# Patient Record
Sex: Female | Born: 1945 | Race: White | Marital: Married | Smoking: Never smoker
Health system: Southern US, Community
[De-identification: ages and names within clinical notes are randomized; demographics above are authoritative.]

## PROBLEM LIST (undated history)

## (undated) DIAGNOSIS — K2980 Duodenitis without bleeding: Secondary | ICD-10-CM

## (undated) DIAGNOSIS — N390 Urinary tract infection, site not specified: Secondary | ICD-10-CM

## (undated) DIAGNOSIS — H269 Unspecified cataract: Secondary | ICD-10-CM

## (undated) DIAGNOSIS — K449 Diaphragmatic hernia without obstruction or gangrene: Secondary | ICD-10-CM

## (undated) DIAGNOSIS — D131 Benign neoplasm of stomach: Secondary | ICD-10-CM

## (undated) DIAGNOSIS — D649 Anemia, unspecified: Secondary | ICD-10-CM

## (undated) DIAGNOSIS — K219 Gastro-esophageal reflux disease without esophagitis: Secondary | ICD-10-CM

## (undated) DIAGNOSIS — K2270 Barrett's esophagus without dysplasia: Principal | ICD-10-CM

## (undated) DIAGNOSIS — M47816 Spondylosis without myelopathy or radiculopathy, lumbar region: Secondary | ICD-10-CM

## (undated) DIAGNOSIS — K5790 Diverticulosis of intestine, part unspecified, without perforation or abscess without bleeding: Secondary | ICD-10-CM

## (undated) DIAGNOSIS — E785 Hyperlipidemia, unspecified: Secondary | ICD-10-CM

## (undated) DIAGNOSIS — R6889 Other general symptoms and signs: Secondary | ICD-10-CM

## (undated) DIAGNOSIS — Q676 Pectus excavatum: Secondary | ICD-10-CM

## (undated) DIAGNOSIS — R011 Cardiac murmur, unspecified: Secondary | ICD-10-CM

## (undated) DIAGNOSIS — R002 Palpitations: Secondary | ICD-10-CM

## (undated) DIAGNOSIS — J45909 Unspecified asthma, uncomplicated: Secondary | ICD-10-CM

## (undated) DIAGNOSIS — J302 Other seasonal allergic rhinitis: Secondary | ICD-10-CM

## (undated) HISTORY — DX: Other seasonal allergic rhinitis: J30.2

## (undated) HISTORY — DX: Unspecified asthma, uncomplicated: J45.909

## (undated) HISTORY — DX: Barrett's esophagus without dysplasia: K22.70

## (undated) HISTORY — DX: Hyperlipidemia, unspecified: E78.5

## (undated) HISTORY — DX: Urinary tract infection, site not specified: N39.0

## (undated) HISTORY — DX: Anemia, unspecified: D64.9

## (undated) HISTORY — DX: Gastro-esophageal reflux disease without esophagitis: K21.9

## (undated) HISTORY — DX: Pectus excavatum: Q67.6

## (undated) HISTORY — DX: Diverticulosis of intestine, part unspecified, without perforation or abscess without bleeding: K57.90

## (undated) HISTORY — PX: SHOULDER SURGERY: SHX246

## (undated) HISTORY — PX: ESOPHAGOGASTRODUODENOSCOPY: SHX1529

## (undated) HISTORY — DX: Other general symptoms and signs: R68.89

## (undated) HISTORY — DX: Duodenitis without bleeding: K29.80

## (undated) HISTORY — PX: DILATION AND CURETTAGE OF UTERUS: SHX78

## (undated) HISTORY — PX: COLONOSCOPY: SHX174

## (undated) HISTORY — DX: Benign neoplasm of stomach: D13.1

## (undated) HISTORY — DX: Unspecified cataract: H26.9

## (undated) HISTORY — DX: Cardiac murmur, unspecified: R01.1

## (undated) HISTORY — DX: Spondylosis without myelopathy or radiculopathy, lumbar region: M47.816

## (undated) HISTORY — DX: Palpitations: R00.2

## (undated) HISTORY — PX: TONSILLECTOMY: SUR1361

## (undated) HISTORY — DX: Diaphragmatic hernia without obstruction or gangrene: K44.9

---

## 1998-11-10 ENCOUNTER — Other Ambulatory Visit: Admission: RE | Admit: 1998-11-10 | Discharge: 1998-11-10 | Payer: Self-pay | Admitting: Obstetrics & Gynecology

## 1999-04-27 ENCOUNTER — Other Ambulatory Visit: Admission: RE | Admit: 1999-04-27 | Discharge: 1999-04-27 | Payer: Self-pay | Admitting: Obstetrics & Gynecology

## 2000-07-18 ENCOUNTER — Other Ambulatory Visit: Admission: RE | Admit: 2000-07-18 | Discharge: 2000-07-18 | Payer: Self-pay | Admitting: Obstetrics and Gynecology

## 2001-09-17 ENCOUNTER — Other Ambulatory Visit: Admission: RE | Admit: 2001-09-17 | Discharge: 2001-09-17 | Payer: Self-pay | Admitting: Obstetrics & Gynecology

## 2002-09-30 ENCOUNTER — Other Ambulatory Visit: Admission: RE | Admit: 2002-09-30 | Discharge: 2002-09-30 | Payer: Self-pay | Admitting: Obstetrics & Gynecology

## 2003-10-01 ENCOUNTER — Other Ambulatory Visit: Admission: RE | Admit: 2003-10-01 | Discharge: 2003-10-01 | Payer: Self-pay | Admitting: Obstetrics & Gynecology

## 2004-10-10 ENCOUNTER — Other Ambulatory Visit: Admission: RE | Admit: 2004-10-10 | Discharge: 2004-10-10 | Payer: Self-pay | Admitting: Obstetrics & Gynecology

## 2013-11-05 ENCOUNTER — Other Ambulatory Visit: Payer: Self-pay | Admitting: Obstetrics & Gynecology

## 2013-11-05 DIAGNOSIS — R928 Other abnormal and inconclusive findings on diagnostic imaging of breast: Secondary | ICD-10-CM

## 2013-11-09 ENCOUNTER — Ambulatory Visit
Admission: RE | Admit: 2013-11-09 | Discharge: 2013-11-09 | Disposition: A | Discharge status: Home / Self Care | Payer: Medicare Other | Source: Ambulatory Visit | Attending: Obstetrics & Gynecology | Admitting: Obstetrics & Gynecology

## 2013-11-09 ENCOUNTER — Encounter (INDEPENDENT_AMBULATORY_CARE_PROVIDER_SITE_OTHER): Payer: Self-pay

## 2013-11-09 ENCOUNTER — Other Ambulatory Visit: Payer: Self-pay | Admitting: Obstetrics & Gynecology

## 2013-11-09 DIAGNOSIS — R928 Other abnormal and inconclusive findings on diagnostic imaging of breast: Secondary | ICD-10-CM

## 2013-11-12 ENCOUNTER — Other Ambulatory Visit: Payer: Self-pay

## 2014-06-18 ENCOUNTER — Encounter: Payer: Self-pay | Admitting: Internal Medicine

## 2014-08-25 ENCOUNTER — Ambulatory Visit (INDEPENDENT_AMBULATORY_CARE_PROVIDER_SITE_OTHER): Payer: Medicare Other | Admitting: Internal Medicine

## 2014-08-25 ENCOUNTER — Encounter: Payer: Self-pay | Admitting: Internal Medicine

## 2014-08-25 VITALS — BP 140/88 | HR 72 | Ht 62.5 in | Wt 125.0 lb

## 2014-08-25 DIAGNOSIS — R0989 Other specified symptoms and signs involving the circulatory and respiratory systems: Secondary | ICD-10-CM

## 2014-08-25 DIAGNOSIS — F458 Other somatoform disorders: Secondary | ICD-10-CM

## 2014-08-25 DIAGNOSIS — R1310 Dysphagia, unspecified: Secondary | ICD-10-CM

## 2014-08-25 DIAGNOSIS — K449 Diaphragmatic hernia without obstruction or gangrene: Secondary | ICD-10-CM

## 2014-08-25 DIAGNOSIS — K2270 Barrett's esophagus without dysplasia: Secondary | ICD-10-CM

## 2014-08-25 HISTORY — DX: Barrett's esophagus without dysplasia: K22.70

## 2014-08-25 MED ORDER — RABEPRAZOLE SODIUM 20 MG PO TBEC: 20.0000 mg | DELAYED_RELEASE_TABLET | Freq: Every day | ORAL | Status: DC

## 2014-08-25 NOTE — Progress Notes (Signed)
Subjective:    Patient ID: Anna Price, female    DOB: [birth year], 69 y.o.   MRN: [id number] Cc: Barrett's esophagus, globus, dysphagia? HPI The patient is a very nice elderly woman here from [place], at the recommendation of one of her friends who is my patient. 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 Short segment never had any dysplasia. Most recent endoscopy in September 2014 in [place]. He has a hiatal hernia I'm not exactly sure the size 3-4 cm perhaps greater. She has years of intermittent but fairly regular globus sensation and some question of dysphagia but that has been rare. Pills and food feel like they sit in the suprasternal notch after eating them. Last year or so she regurgitated after having a problem with a [place] chip. She is concerned that she is triggered problems since then. She is very concerned about the possible progression of Barrett's esophagus to cancer. Her father had Barrett's esophagus and limited to be in his 58s. She had an aunt with what sounds like GE junction cancer is diagnosed correctly but she looked for a decade with that so that makes me question that diagnosis. Over the years she has tried every single PPI it sounds like. She has had a fundic gland polyp or polyps. He has not had unexplained weight loss or bleeding. She thinks the best PPI has been AcipHex and recently went back to that and feels a little bit better. PPI medications do sometimes give her loose stools or diarrhea.  Note that her symptoms are globus, she actually regurgitation and reflux fluid at times but she does not have any classic heartburn. She probably does not have dysphagia.  She has recently given up sleeping on a wedge and she has done that for years and it does not seem to help. She does not smoke is only one caffeinated beverage daily. Allergies  Allergen Reactions  . Atropine   . Codeine   . Demerol [Meperidine]   . Erythromycin   . Nexium [Esomeprazole  Magnesium] Other (See Comments)    Violent headache  . Phenergan [Promethazine Hcl] Other (See Comments)    Rectal pain  . Cortisone Rash  . Prednisone Rash   Outpatient Prescriptions Prior to Visit  Medication Sig Dispense Refill  . fluticasone (FLOVENT HFA) 220 MCG/ACT inhaler Inhale 1 puff into the lungs as needed.     Marland Kitchen omeprazole-sodium bicarbonate (ZEGERID) 40-1100 MG per capsule Take 1 capsule by mouth daily before breakfast.    . ranitidine (ZANTAC) 300 MG tablet Take 300 mg by mouth at bedtime.     No facility-administered medications prior to visit.   Past Medical History  Diagnosis Date  . GERD (gastroesophageal reflux disease)   . Barrett esophagus   . Diverticulosis   . Hiatal hernia   . Duodenitis     chronic  . Hyperlipemia   . UTI (lower urinary tract infection)   . Heart palpitations     slow heart valve leak not thought to be a problem  . Pectus excavatum   . Fundic gland polyps of stomach, benign    Past Surgical History  Procedure Laterality Date  . Esophagogastroduodenoscopy    . Colonoscopy    . Dilation and curettage of uterus    . Tonsillectomy    . Shoulder surgery     History   Social History  . Marital Status: Married    Spouse Name: N/A  . Number of Children: N/A  .  Years of Education: N/A   Occupational History  . retired    Social History Main Topics  . Smoking status: Never Smoker   . Smokeless tobacco: Never Used  . Alcohol Use: 0.0 oz/week    0 Standard drinks or equivalent per week  . Drug Use: Not on file  . Sexual Activity: Not on file   Other Topics Concern  . None   Social History Narrative   Married   2 sons 1 is Paediatric nurse and daughter-in-law rheumatologist in [place]   Retired Pharmacist, hospital   1 cup coffee qd   08/25/2014      Family History  Problem Relation Age of Onset  . Stroke Mother     brain stem  . Congestive Heart Failure Father   . Esophageal cancer      aunt  . Parkinsonism Father   . Colon  cancer Neg Hx     Review of Systems As per history of present illness. All other review of systems are negative.    Objective:   Physical Exam @BP  140/88 mmHg  Pulse 72  Ht 5' 2.5" (1.588 m)  Wt 125 lb (56.7 kg)  BMI 22.48 kg/m2@  General:  Well-developed, well-nourished and in no acute distress Eyes:  anicteric. Neck:   supple w/o thyromegaly or mass.  Lungs: Clear to auscultation bilaterally. Heart:  S1S2, no rubs, murmurs, gallops. Abdomen:  soft, non-tender, no hepatosplenomegaly, hernia, or mass and BS+.  Lymph:  no cervical or supraclavicular adenopathy. Extremities:   no edema, cyanosis or clubbing Skin   no rash. Neuro:  A&O x 3.  Psych:  appropriate mood and  Affect overall but she does seem a little anxious    Data Reviewed: Numerous egd, pathology and GI office notes [phone] Pathology reports also Colonoscopy reports.    Assessment & Plan:   1. Barrett's esophagus   2. Hiatal hernia   3. Globus sensation   4. Dysphagia - ?    Long history of short segment Barrett's esophagus. One time she had an endoscopy without it. There is a hiatal hernia. The gentleman to question if she truly has Barrett's or intestinal metaplasia and a hiatal hernia sac. Symptoms of globus of unclear etiology. At first I did not think it was anxiety but has time has gone on in the discussion over her situation and scheduling her procedures she certainly at least has health related anxiety over this condition. She does freely admit she is scared of cancer.  ? hiatal hernia is causing some of her problems. I think this needs better characterization.  Plans are for #1 upper GI series #2 upper GI endoscopy to evaluate Barrett's esophagus and hiatal hernia #3 refill rabeprazole 20 mg daily at this point #4 I have explained that if she does indeed have Barrett's esophagus regular PPI therapy is recommended. He would like to come off it she has concerns over potential health issues such as  osteoporosis and renal failure etc. #5 I have reassured as best I can. Continue to do so. #6 Kaopectate or Imodium for loose stools and diarrhea associated with PPI. #7 she says she can live with these symptoms as long as she knows it is not cancer. Some consideration could be given to manometry and pH impedance testing depending upon where we need to go. #8 consider repeat routine colonoscopy versus other screening in 2021  I appreciate the opportunity to care for this patient. CC: Paulo Fruit, MD   60 minutes total  time involved in patient care with me today

## 2014-08-25 NOTE — Patient Instructions (Addendum)
  You have been scheduled for an endoscopy. Please follow written instructions given to you at your visit today. If you use inhalers (even only as needed), please bring them with you on the day of your procedure.   You have been scheduled for an Upper GI Series at [facility]. Your appointment is on  08/30/14 at 10 :00 AM . Please arrive 15 minutes prior to your test for registration. Make sure not to eat or drink anything after midnight on the night before your test. If you need to reschedule, please call radiology at [phone]. ________________________________________________________________ An upper GI series uses x rays to help diagnose problems of the upper GI tract, which includes the esophagus, stomach, and duodenum. The duodenum is the first part of the small intestine. An upper GI series is conducted by a radiology technologist or a radiologist-a doctor who specializes in x-ray imaging-at a hospital or outpatient center. While sitting or standing in front of an x-ray machine, the patient drinks barium liquid, which is often white and has a chalky consistency and taste. The barium liquid coats the lining of the upper GI tract and makes signs of disease show up more clearly on x rays. X-ray video, called fluoroscopy, is used to view the barium liquid moving through the esophagus, stomach, and duodenum. Additional x rays and fluoroscopy are performed while the patient lies on an x-ray table. To fully coat the upper GI tract with barium liquid, the technologist or radiologist may press on the abdomen or ask the patient to change position. Patients hold still in various positions, allowing the technologist or radiologist to take x rays of the upper GI tract at different angles. If a technologist conducts the upper GI series, a radiologist will later examine the images to look for problems.  This test typically takes about 1 hour to  complete. __________________________________________________________________   We have sent the following medications to your pharmacy for you to pick up at your convenience: Aciphex  You may use Kaopectate or Imodium for diarrhea as needed.  I appreciate the opportunity to care for you. Silvano Rusk, MD, [facility]

## 2014-08-30 ENCOUNTER — Ambulatory Visit (HOSPITAL_COMMUNITY)
Admission: RE | Admit: 2014-08-30 | Discharge: 2014-08-30 | Disposition: A | Discharge status: Home / Self Care | Payer: Medicare Other | Source: Ambulatory Visit | Attending: Internal Medicine | Admitting: Internal Medicine

## 2014-08-30 ENCOUNTER — Telehealth: Payer: Self-pay | Admitting: Internal Medicine

## 2014-08-30 DIAGNOSIS — K219 Gastro-esophageal reflux disease without esophagitis: Secondary | ICD-10-CM | POA: Diagnosis not present

## 2014-08-30 DIAGNOSIS — K449 Diaphragmatic hernia without obstruction or gangrene: Secondary | ICD-10-CM | POA: Diagnosis not present

## 2014-08-30 DIAGNOSIS — R0989 Other specified symptoms and signs involving the circulatory and respiratory systems: Secondary | ICD-10-CM

## 2014-08-30 DIAGNOSIS — R09A2 Foreign body sensation, throat: Secondary | ICD-10-CM

## 2014-08-30 NOTE — Telephone Encounter (Signed)
Patient advised she will keep the appt

## 2014-08-30 NOTE — Progress Notes (Signed)
Quick Note:  Patient aware of results See phone note ______

## 2014-08-30 NOTE — Telephone Encounter (Signed)
1) I do not think her globus is from reflux 2) EGD is optional at this time - I had recommended it as we discussed given her fear of Barrett's and cancer - to try to clear up if she actually had Barrett's which is uncertain  3) If she does not follow-through with EGD she should make a non-urgent f/u appt

## 2014-08-30 NOTE — Telephone Encounter (Signed)
See questions from the patient  

## 2014-08-31 ENCOUNTER — Other Ambulatory Visit (HOSPITAL_COMMUNITY): Payer: Medicare Other

## 2014-09-03 ENCOUNTER — Ambulatory Visit (AMBULATORY_SURGERY_CENTER): Payer: Medicare Other | Admitting: Internal Medicine

## 2014-09-03 ENCOUNTER — Encounter: Payer: Self-pay | Admitting: Internal Medicine

## 2014-09-03 VITALS — BP 116/80 | HR 76 | Temp 97.7°F | Resp 18 | Ht 62.5 in | Wt 125.0 lb

## 2014-09-03 DIAGNOSIS — K2270 Barrett's esophagus without dysplasia: Secondary | ICD-10-CM

## 2014-09-03 DIAGNOSIS — R0989 Other specified symptoms and signs involving the circulatory and respiratory systems: Secondary | ICD-10-CM

## 2014-09-03 DIAGNOSIS — K449 Diaphragmatic hernia without obstruction or gangrene: Secondary | ICD-10-CM | POA: Diagnosis not present

## 2014-09-03 DIAGNOSIS — F458 Other somatoform disorders: Secondary | ICD-10-CM

## 2014-09-03 DIAGNOSIS — R6889 Other general symptoms and signs: Secondary | ICD-10-CM

## 2014-09-03 HISTORY — DX: Other specified symptoms and signs involving the circulatory and respiratory systems: R09.89

## 2014-09-03 HISTORY — DX: Other general symptoms and signs: R68.89

## 2014-09-03 MED ORDER — SODIUM CHLORIDE 0.9 % IV SOLN
500.0000 mL | INTRAVENOUS | Status: DC
Start: 2014-09-03 — End: 2014-09-03

## 2014-09-03 NOTE — Progress Notes (Signed)
To recovery, report to En[place], RN, VSS 

## 2014-09-03 NOTE — Op Note (Signed)
[place]  [address] [place], [address]   ENDOSCOPY PROCEDURE REPORT  PATIENT: Anna, Price  MR#: [id number] BIRTHDATE: 02-27-1945 , 18  yrs. old GENDER: female ENDOSCOPIST: Gatha Mayer, MD, [facility] PROCEDURE DATE:  09/03/2014 PROCEDURE:  EGD, diagnostic ASA CLASS:     Class II INDICATIONS:  Globus and ? dysphagia - ? hx Barrett's. MEDICATIONS: Propofol 160 mg IV and Monitored anesthesia care TOPICAL ANESTHETIC: none  DESCRIPTION OF PROCEDURE: After the risks benefits and alternatives of the procedure were thoroughly explained, informed consent was obtained.  The LB [id number] endoscope was introduced through the mouth and advanced to the second portion of the duodenum , Without limitations.  The instrument was slowly withdrawn as the mucosa was fully examined.    1) 3-4 cm hiatal hernia 2) Fundic gland polyps of proximal stomach (prior biopsies proved this) 3) Otherwise normal EGD - no strictures, no Barrett's changes in the distal esophagus/GE junction area.  Retroflexed views revealed a hiatal hernia.     The scope was then withdrawn from the patient and the procedure completed.  COMPLICATIONS: There were no immediate complications.  ENDOSCOPIC IMPRESSION: 1) 3-4 cm hiatal hernia 2) Fundic gland polyps of proximal stomach (prior biopsies proved this) 3) Otherwise normal EGD - no strictures, no Barrett's changes in the distal esophagus/GE junction area  RECOMMENDATIONS: Continue current tx anxiety might be a cause of globus Would not do further routine EGD see me as needed   eSigned:  Gatha Mayer, MD, [facility] 09/03/2014 11:23 AM    CC:The Patient and Vania Rea, MD

## 2014-09-03 NOTE — Patient Instructions (Addendum)
I saw a small hiatal hernia and the fundic gland polyps. The polyps are not pre-cancerous and do not need follow-up. I do not think you have barrett's esophagus. I think the previous biopsies were from the hiatal hernia sac and not the esophagus or the Barrett's is gone.  The sensation in your neck or throat area is called globus - you have had it a long time. Sometimes it comes from anxiety/stress. No abnormalities seen here explain it. It is not related to cancer.  I appreciate the opportunity to care for you. Gatha Mayer, MD, FACG  YOU HAD AN ENDOSCOPIC PROCEDURE TODAY AT [facility]:   Refer to the procedure report that was given to you for any specific questions about what was found during the examination.  If the procedure report does not answer your questions, please call your gastroenterologist to clarify.  If you requested that your care partner not be given the details of your procedure findings, then the procedure report has been included in a sealed envelope for you to review at your convenience later.  YOU SHOULD EXPECT: Some feelings of bloating in the abdomen. Passage of more gas than usual.  Walking can help get rid of the air that was put into your GI tract during the procedure and reduce the bloating. If you had a lower endoscopy (such as a colonoscopy or flexible sigmoidoscopy) you may notice spotting of blood in your stool or on the toilet paper. If you underwent a bowel prep for your procedure, you may not have a normal bowel movement for a few days.  Please Note:  You might notice some irritation and congestion in your nose or some drainage.  This is from the oxygen used during your procedure.  There is no need for concern and it should clear up in a day or so.  SYMPTOMS TO REPORT IMMEDIATELY:   Following upper endoscopy (EGD)  Vomiting of blood or coffee ground material  New chest pain or pain under the shoulder blades  Painful or persistently  difficult swallowing  New shortness of breath  Fever of 100F or higher  Black, tarry-looking stools  For urgent or emergent issues, a gastroenterologist can be reached at any hour by calling [phone].   DIET: Your first meal following the procedure should be a small meal and then it is ok to progress to your normal diet. Heavy or fried foods are harder to digest and may make you feel nauseous or bloated.  Likewise, meals heavy in dairy and vegetables can increase bloating.  Drink plenty of fluids but you should avoid alcoholic beverages for 24 hours.  ACTIVITY:  You should plan to take it easy for the rest of today and you should NOT DRIVE or use heavy machinery until tomorrow (because of the sedation medicines used during the test).    FOLLOW UP: Our staff will call the number listed on your records the next business day following your procedure to check on you and address any questions or concerns that you may have regarding the information given to you following your procedure. If we do not reach you, we will leave a message.  However, if you are feeling well and you are not experiencing any problems, there is no need to return our call.  We will assume that you have returned to your regular daily activities without incident.  If any biopsies were taken you will be contacted by phone or by letter within the  next 1-3 weeks.  Please call us at [phone] if you have not heard about the biopsies in 3 weeks.    SIGNATURES/CONFIDENTIALITY: You and/or your care partner have signed paperwork which will be entered into your electronic medical record.  These signatures attest to the fact that that the information above on your After Visit Summary has been reviewed and is understood.  Full responsibility of the confidentiality of this discharge information lies with you and/or your care-partner.

## 2014-09-06 ENCOUNTER — Telehealth: Payer: Self-pay | Admitting: *Deleted

## 2014-09-06 NOTE — Telephone Encounter (Signed)
  Follow up Call-  Call back number 09/03/2014  Post procedure Call Back phone  # [phone]  Permission to leave phone message Yes     Patient questions:  Do you have a fever, pain , or abdominal swelling? No. Pain Score  0 *  Have you tolerated food without any problems? Yes.    Have you been able to return to your normal activities? Yes.    Do you have any questions about your discharge instructions: Diet   No. Medications  No. Follow up visit  No.  Do you have questions or concerns about your Care? No.  Actions: * If pain score is 4 or above: No action needed, pain <4.

## 2014-12-21 ENCOUNTER — Ambulatory Visit: Payer: Self-pay | Admitting: Allergy and Immunology

## 2014-12-22 ENCOUNTER — Encounter: Payer: Self-pay | Admitting: Allergy and Immunology

## 2014-12-22 ENCOUNTER — Ambulatory Visit (INDEPENDENT_AMBULATORY_CARE_PROVIDER_SITE_OTHER): Payer: Medicare Other | Admitting: Allergy and Immunology

## 2014-12-22 VITALS — BP 134/90 | HR 84 | Temp 97.7°F | Resp 20 | Ht 61.0 in | Wt 123.5 lb

## 2014-12-22 DIAGNOSIS — J4530 Mild persistent asthma, uncomplicated: Secondary | ICD-10-CM | POA: Diagnosis not present

## 2014-12-22 DIAGNOSIS — J3089 Other allergic rhinitis: Secondary | ICD-10-CM | POA: Diagnosis not present

## 2014-12-22 DIAGNOSIS — K219 Gastro-esophageal reflux disease without esophagitis: Secondary | ICD-10-CM

## 2014-12-22 DIAGNOSIS — J387 Other diseases of larynx: Secondary | ICD-10-CM

## 2014-12-22 MED ORDER — ALBUTEROL SULFATE (2.5 MG/3ML) 0.083% IN NEBU: 2.5000 mg | INHALATION_SOLUTION | Freq: Once | RESPIRATORY_TRACT | Status: DC

## 2014-12-22 MED ORDER — ALBUTEROL SULFATE 108 (90 BASE) MCG/ACT IN AEPB: 2.0000 | INHALATION_SPRAY | RESPIRATORY_TRACT | Status: DC | PRN

## 2014-12-22 MED ORDER — BUDESONIDE 180 MCG/ACT IN AEPB: 2.0000 | INHALATION_SPRAY | Freq: Two times a day (BID) | RESPIRATORY_TRACT | Status: DC

## 2014-12-22 NOTE — Progress Notes (Signed)
[place]    NEW PATIENT NOTE  Referring Provider: Josem Kaufmann, MD Primary Provider: Paulo Fruit, MD    Subjective:   Patient ID: Anna Price is a 69 y.o. female with a chief complaint of Allergies  and the following problems:  HPI Comments:  Anna Price presents this clinic in evaluation of breathing problems. Apparently 6 weeks ago while driving to the beach and exposed to a pine [facility] she started to develop problems with throat clearing. She then had exposure to tobacco smoke while at a rest stop and quickly developed problems with coughing and wheezing and burning in her chest that progressed over 24 hours requiring her to get evaluation at an urgent care center. It does not sound as she received any systemic steroids that she's had a history of developing a rash with the administration of cortisone. She was given an inhaler which she really didn't use any large degree. She slowly improved and at this point time has very little problem with her respiratory tract. However, she does relate a history of sometimes developing coughing and wheezing in the evening which is not uncommon event is been going on for a prolonged period in time even preceding her episode at the beach. And when she walks at the mall for exercise she sometimes does get a little short of breath but it never stops her from completing her exercise. She does not make any ugly sputum production nor does she have any associated chest tightness or shortness of breath. She gets bronchitis about every 5 years or so but she thinks her last episode might of been 2 years ago and it appeared to occur in the early fall. She does have some minimal symptoms with her upper airways in the form of developing sneezing when she gets around dust that does not require any therapy. She apparently had a recent chest x-ray with a follow-up chest CT scan which did not identify any worrisome  abnormality although I do not have the results of those scans of available for review today.  It should be noted that [name] has rather significant gastroesophageal reflux disease and has been bothered by this problem for most of her life. She was told that she had Barrett's esophagus in the past but apparently the last endoscopy performed by Dr. Carlean Purl did not identify any Barrett's esophagus. She's been slowly tapering off her proton pump inhibitor because of chronic diarrhea caused by these medications and she's been off all proton pump inhibitor for possibly 2 months. She did try some ranitidine but unfortunately this also gave rise to diarrhea. Since she's been without a proton pump inhibitor she's definitely been having problems with regurgitation. She's been slowly tapering off caffeine yet still continues to drink caffeinated coffee multiple times per week. She does not eat any chocolate. She drinks wine on a pretty consistent basis in the treatment of her hypercholesterolemia. She does have a wedge on her bed.   Past Medical History  Diagnosis Date  . GERD (gastroesophageal reflux disease)   . Barrett esophagus   . Diverticulosis   . Hiatal hernia   . Duodenitis     chronic  . Hyperlipemia   . UTI (lower urinary tract infection)   . Heart palpitations     slow heart valve leak not thought to be a problem  . Pectus excavatum   . Fundic gland polyps of stomach, benign   . Anemia   .  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 Heart murmur   . Chronic throat clearing 09/03/2014  . Barrett's esophagus - in past not on last EGD and others 08/25/2014    Past Surgical History  Procedure Laterality Date  . Esophagogastroduodenoscopy    . Colonoscopy    . Dilation and curettage of uterus    . Tonsillectomy    . Shoulder surgery      Outpatient Encounter Prescriptions as of 12/22/2014  Medication Sig  . baclofen (LIORESAL) 10 MG tablet TK 1 T PO TID FOR 10 DAYS PRN  . Nitrofurantoin Macrocrystal  (MACRODANTIN PO) Take 1 tablet by mouth as needed.  Marland Kitchen PROAIR HFA 108 (90 BASE) MCG/ACT inhaler INL 2 PFS PO Q 4 H PRN  . RABEprazole (ACIPHEX) 20 MG tablet Take 1 tablet (20 mg total) by mouth daily. (Patient not taking: Reported on 12/22/2014)   No facility-administered encounter medications on file as of 12/22/2014.    No orders of the defined types were placed in this encounter.    Allergies  Allergen Reactions  . Atropine   . Codeine   . Demerol [Meperidine]   . Erythromycin   . Nexium [Esomeprazole Magnesium] Other (See Comments)    Violent headache  . Phenergan [Promethazine Hcl] Other (See Comments)    Rectal pain  . Cortisone Rash  . Prednisone Rash    Review of Systems  Constitutional: Negative for fever and chills.  HENT: Positive for sneezing. Negative for congestion, ear pain, facial swelling, nosebleeds, postnasal drip, rhinorrhea, sinus pressure, sore throat, tinnitus, trouble swallowing and voice change.   Eyes: Negative for pain, discharge, redness and itching.  Respiratory: Positive for cough. Negative for choking, chest tightness, shortness of breath, wheezing and stridor.   Cardiovascular: Negative for chest pain and leg swelling.  Gastrointestinal: Positive for nausea. Negative for vomiting and abdominal pain.  Endocrine: Negative for cold intolerance and heat intolerance.  Genitourinary: Negative for difficulty urinating.  Musculoskeletal: Negative for myalgias and arthralgias.       She had a bout of significant arthralgias and myalgias that developed 30 minutes after receiving the flu vaccine at the age of 20 that lasted approximately 36 hours duration. It was not associated with any fever or other systemic or constitutional symptoms. Otherwise, she does not have a tremendous amount of musculoskeletal problems.  Allergic/Immunologic: Negative.   Neurological: Negative for dizziness.  Hematological: Negative for adenopathy.    Family History  Problem  Relation Age of Onset  . Stroke Mother     brain stem  . Congestive Heart Failure Father   . Parkinsonism Father   . Esophageal cancer      aunt  . Colon cancer Neg Hx   . Breast cancer Sister     Social History   Social History  . Marital Status: Married    Spouse Name: N/A  . Number of Children: N/A  . Years of Education: N/A   Occupational History  . retired    Social History Main Topics  . Smoking status: Never Smoker   . Smokeless tobacco: Never Used  . Alcohol Use: 0.0 oz/week    0 Standard drinks or equivalent per week     Comment: 1-2 red wine daily  . Drug Use: No  . Sexual Activity: Not on file   Other Topics Concern  . Not on file   Social History Narrative   Married   2 sons 1 is dermatologist and daughter-in-law rheumatologist in [place]  [place]   Retired Pharmacist, hospital   1 cup coffee qd   08/25/2014       Environmental and Social history  Lives in a house with a dry environment, a dog and cat located inside the household, carpeting in the bedroom, sleeping on a soft mattress without any plastic for the bed and pillow, and no smokers located inside the household.   Objective:   Filed Vitals:   12/22/14 0853  BP: 134/90  Pulse: 84  Temp: 97.7 F (36.5 C)  Resp: 20   Height: 5\' 1"  (154.9 cm) Weight: 123 lb 7.3 oz (56 kg)  Physical Exam  Constitutional: She appears well-developed and well-nourished. No distress.  HENT:  Head: Normocephalic and atraumatic. Head is without right periorbital erythema and without left periorbital erythema.  Right Ear: Tympanic membrane, external ear and ear canal normal. No drainage or tenderness. No foreign bodies. Tympanic membrane is not injected, not scarred, not perforated, not erythematous, not retracted and not bulging. No middle ear effusion.  Left Ear: Tympanic membrane, external ear and ear canal normal. No drainage or tenderness. No foreign bodies. Tympanic membrane is not injected, not scarred, not perforated, not  erythematous, not retracted and not bulging.  No middle ear effusion.  Nose: Nose normal. No mucosal edema, rhinorrhea, nose lacerations or sinus tenderness.  No foreign bodies.  Mouth/Throat: Oropharynx is clear and moist. No oropharyngeal exudate, posterior oropharyngeal edema, posterior oropharyngeal erythema or tonsillar abscesses.  Eyes: Lids are normal. Right eye exhibits no chemosis, no discharge and no exudate. No foreign body present in the right eye. Left eye exhibits no chemosis, no discharge and no exudate. No foreign body present in the left eye. Right conjunctiva is not injected. Left conjunctiva is not injected.  Neck: Neck supple. No tracheal tenderness present. No tracheal deviation and no edema present. No thyroid mass and no thyromegaly present.  Cardiovascular: Normal rate, regular rhythm, S1 normal and S2 normal.  Exam reveals no gallop.   No murmur heard. Pulmonary/Chest: No accessory muscle usage or stridor. No respiratory distress. She has no wheezes. She has no rhonchi. She has no rales.  Slight pectus excavatum  Abdominal: Soft. There is no hepatosplenomegaly. There is no tenderness. There is no rigidity, no rebound and no guarding.  Musculoskeletal: She exhibits no edema.  Lymphadenopathy:       Head (right side): No tonsillar adenopathy present.       Head (left side): No tonsillar adenopathy present.    She has no cervical adenopathy.  Neurological: She is alert.  Skin: No rash noted. She is not diaphoretic.  Psychiatric: She has a normal mood and affect. Her behavior is normal.    Diagnostics:  Allergy skin tests were performed. She demonstrated hypersensitivity against grasses, weeds, trees, and house dust mite on epi cutaneous testing. She demonstrated hypersensitivity to ragweed, mold, and cat on intradermal skin testing  Spirometry was performed and demonstrated an FEV1 of 1.26 @ 64 % of predicted. Following the administration of nebulized albuterol her FEV1  did not change significantly.  The patient had an Asthma Control Test with the following results:  .  Not completed   Assessment and Plan:    1. Mild persistent asthma in adult without complication   2. Other allergic rhinitis   3. LPRD (laryngopharyngeal reflux disease)      1. Allergen avoidance measures  2. Treat reflux:   A. No caffeine, no chocolate, minimal alcohol.  B. OTC famotidine 10-20mg  daily  3. Treat  inflammation:   A. Pulmicort 180 two inhalations two times per day.  4. If needed:   A. ProAir Respiclick (coupon) 2 puffs every 4-6 hours  B. OTC antihistamine  5. Return in 4 weeks.  It does indeed appear as though Hanin has significant inflammation of her respiratory tract most likely on the basis of her atopic disease and some contribution from reflux. We'll need to treat both of these conditions with therapy as mentioned above and will make an assessment of how to proceed with further evaluation and treatment pending her response in 4 weeks.       Allena Katz, MD [place]   1.26

## 2014-12-22 NOTE — Patient Instructions (Addendum)
  1. Allergen avoidance measures  2. Treat reflux:   A. No caffeine, no chocolate, minimal alcohol.  B. OTC famotidine 10-20mg  daily  3. Treat inflammation:   A. Pulmicort 180 two inhalations two times per day.  4. If needed:   A. ProAir Respiclick (coupon) 2 puffs every 4-6 hours  B. OTC antihistamine  5. Return in 4 weeks.

## 2014-12-24 ENCOUNTER — Telehealth: Payer: Self-pay | Admitting: Neurology

## 2014-12-24 NOTE — Telephone Encounter (Signed)
Received drug change request form from [facility] to change Pulmicort to Flovent. Spoke with patient on the phone who said she has never tired any other inhalers to do a PA on this medication. Patient does still have sample from last ov with her and will follow up with Dr Neldon Mc in a month to discuse further treatment. Please advise.

## 2015-01-06 ENCOUNTER — Telehealth: Payer: Self-pay

## 2015-01-06 NOTE — Telephone Encounter (Signed)
F. Y. I.      PATIENT CALLED AND WANTS TO LET DR. Neldon Mc KNOW THAT SHE IS NOT TAKING THE QVAR OR THE RESCUE INHALER.  SHE WAS UNABLE TO SLEEP X 3 DAYS WHILE TAKING THESE MEDS.  ONCE SHE D/C MED, HER SLEEPING HABITS WENT BACK TO NORMAL.  SHE WILL KEEP HER FOLLOW UP WITH DR. Raliegh Ip ON THE 30TH.

## 2015-01-19 ENCOUNTER — Encounter: Payer: Self-pay | Admitting: Allergy and Immunology

## 2015-01-19 ENCOUNTER — Ambulatory Visit (INDEPENDENT_AMBULATORY_CARE_PROVIDER_SITE_OTHER): Payer: Medicare Other | Admitting: Allergy and Immunology

## 2015-01-19 VITALS — BP 142/92 | HR 84 | Resp 20 | Ht 61.5 in | Wt 121.3 lb

## 2015-01-19 DIAGNOSIS — K219 Gastro-esophageal reflux disease without esophagitis: Secondary | ICD-10-CM

## 2015-01-19 DIAGNOSIS — J3089 Other allergic rhinitis: Secondary | ICD-10-CM

## 2015-01-19 DIAGNOSIS — J4530 Mild persistent asthma, uncomplicated: Secondary | ICD-10-CM

## 2015-01-19 DIAGNOSIS — J387 Other diseases of larynx: Secondary | ICD-10-CM

## 2015-01-19 NOTE — Patient Instructions (Signed)
   1. Allergen avoidance measures; consider a course of immunotherapy  2. Treat reflux:   A. No caffeine, no chocolate, minimal alcohol.  B. OTC famotidine 10-20mg  daily  C. replace throat clearing with swallowing maneuver  3. Treat inflammation:   A. montelukast 10 mg one tablet one time per day  4. If needed:   A. ProAir Respiclick (coupon) 2 puffs every 4-6 hours  B. OTC antihistamine  5. Return in 12 weeks.

## 2015-01-19 NOTE — Progress Notes (Signed)
[facility]  Follow-up Note  Refering Provider: Vania Rea, MD Primary Provider: Paulo Fruit, MD  Subjective:   Anna Price is a 69 y.o. female who returns to the [facility] [place] in re-evaluation of the following:  HPI Comments:  Shanan returns to this clinic in reevaluation of her asthma and allergic rhinitis and LPR. She's had some difficulty with her medications. She could not tolerate Pulmicort but because it gave rise to CNS stimulation and insomnia. She's had very little problems with her lungs at this point it does not need to use any short acting bronchodilator. She thinks her reflux is under pretty good control at this point. She still has a little bit of throat clearing. She is not eating any chocolate or drinking any caffeine. She is tolerating the famotidine without much diarrhea.   Outpatient Encounter Prescriptions as of 01/19/2015  Medication Sig  . Albuterol Sulfate (PROAIR RESPICLICK) 123XX123 (90 BASE) MCG/ACT AEPB Inhale 2 puffs into the lungs every 4 (four) hours as needed.  . baclofen (LIORESAL) 10 MG tablet TK 1 T PO TID FOR 10 DAYS PRN  . budesonide (PULMICORT FLEXHALER) 180 MCG/ACT inhaler Inhale 2 puffs into the lungs 2 (two) times daily.  . Nitrofurantoin Macrocrystal (MACRODANTIN PO) Take 1 tablet by mouth as needed.  Marland Kitchen PROAIR HFA 108 (90 BASE) MCG/ACT inhaler INL 2 PFS PO Q 4 H PRN  . RABEprazole (ACIPHEX) 20 MG tablet Take 1 tablet (20 mg total) by mouth daily. (Patient not taking: Reported on 12/22/2014)   Facility-Administered Encounter Medications as of 01/19/2015  Medication  . albuterol (PROVENTIL) (2.5 MG/3ML) 0.083% nebulizer solution 2.5 mg    No orders of the defined types were placed in this encounter.    Past Medical History  Diagnosis Date  . GERD (gastroesophageal reflux disease)   . Barrett esophagus   . Diverticulosis   . Hiatal hernia   . Duodenitis     chronic   . Hyperlipemia   . UTI (lower urinary tract infection)   . Heart palpitations     slow heart valve leak not thought to be a problem  . Pectus excavatum   . Fundic gland polyps of stomach, benign   . Anemia   . 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 Heart murmur   . Chronic throat clearing 09/03/2014  . Barrett's esophagus - in past not on last EGD and others 08/25/2014    Past Surgical History  Procedure Laterality Date  . Esophagogastroduodenoscopy    . Colonoscopy    . Dilation and curettage of uterus    . Tonsillectomy    . Shoulder surgery      Allergies  Allergen Reactions  . Atropine   . Codeine   . Demerol [Meperidine]   . Erythromycin   . Nexium [Esomeprazole Magnesium] Other (See Comments)    Violent headache  . Phenergan [Promethazine Hcl] Other (See Comments)    Rectal pain  . Cortisone Rash  . Prednisone Rash    Review of Systems: Noncontributory   Objective:   Filed Vitals:   01/19/15 1055  BP: 142/92  Pulse: 84  Resp: 20   Height: 5' 1.5" (156.2 cm)  Weight: 121 lb 4.1 oz (55 kg)   Physical Exam  Constitutional: She appears well-developed and well-nourished. No distress.  HENT:  Head: Normocephalic and atraumatic. Head is without right periorbital erythema and without left periorbital erythema.  Right  Ear: Tympanic membrane, external ear and ear canal normal. No drainage or tenderness. No foreign bodies. Tympanic membrane is not injected, not scarred, not perforated, not erythematous, not retracted and not bulging. No middle ear effusion.  Left Ear: Tympanic membrane, external ear and ear canal normal. No drainage or tenderness. No foreign bodies. Tympanic membrane is not injected, not scarred, not perforated, not erythematous, not retracted and not bulging.  No middle ear effusion.  Nose: Nose normal. No mucosal edema, rhinorrhea, nose lacerations or sinus tenderness.  No foreign bodies.  Mouth/Throat: Oropharynx is clear and moist. No oropharyngeal  exudate, posterior oropharyngeal edema, posterior oropharyngeal erythema or tonsillar abscesses.  Eyes: Lids are normal. Right eye exhibits no chemosis, no discharge and no exudate. No foreign body present in the right eye. Left eye exhibits no chemosis, no discharge and no exudate. No foreign body present in the left eye. Right conjunctiva is not injected. Left conjunctiva is not injected.  Neck: Neck supple. No tracheal tenderness present. No tracheal deviation and no edema present. No thyroid mass and no thyromegaly present.  Cardiovascular: Normal rate, regular rhythm, S1 normal and S2 normal.  Exam reveals no gallop.   No murmur heard. Pulmonary/Chest: No accessory muscle usage or stridor. No respiratory distress. She has no wheezes. She has no rhonchi. She has no rales.  Abdominal: Soft.  Lymphadenopathy:       Head (right side): No tonsillar adenopathy present.       Head (left side): No tonsillar adenopathy present.    She has no cervical adenopathy.  Neurological: She is alert.  Skin: No rash noted. She is not diaphoretic.  Psychiatric: She has a normal mood and affect. Her behavior is normal.    Diagnostics:    Spirometry was performed and demonstrated an FEV1 of 1.2 to at 63 % of predicted.  The patient had an Asthma Control Test with the following results:  .    Assessment and Plan:   1. Mild persistent asthma, uncomplicated   2. Other allergic rhinitis   3. LPRD (laryngopharyngeal reflux disease)       1. Allergen avoidance measures; consider a course of immunotherapy  2. Treat reflux:   A. No caffeine, no chocolate, minimal alcohol.  B. OTC famotidine 10-20mg  daily  C. replace throat clearing with swallowing maneuver  3. Treat inflammation:   A. montelukast 10 mg one tablet one time per day  4. If needed:   A. ProAir Respiclick (coupon) 2 puffs every 4-6 hours  B. OTC antihistamine  5. Return in 12 weeks.   It is rather difficult to treat Maikayla based  upon the fact that she has side effects of just about every medication. When I offered up montelukast as an anti-inflammatory agent should inform me that this made her feel like a zombie in the past and I'm not really sure she's going to use this medication at this point. 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 We'll see what happens over the course the next 12 weeks.       Allena Katz, MD [place]

## 2015-03-03 DIAGNOSIS — Z85828 Personal history of other malignant neoplasm of skin: Secondary | ICD-10-CM | POA: Insufficient documentation

## 2015-04-19 ENCOUNTER — Ambulatory Visit: Payer: Medicare Other | Admitting: Allergy and Immunology

## 2015-05-27 ENCOUNTER — Ambulatory Visit (INDEPENDENT_AMBULATORY_CARE_PROVIDER_SITE_OTHER): Payer: Medicare Other | Admitting: Family Medicine

## 2015-05-27 ENCOUNTER — Encounter (INDEPENDENT_AMBULATORY_CARE_PROVIDER_SITE_OTHER): Payer: Self-pay | Admitting: Family Medicine

## 2015-05-27 VITALS — BP 167/85 | HR 95 | Temp 98.6°F | Resp 16 | Ht 62.5 in | Wt 125.0 lb

## 2015-05-27 DIAGNOSIS — R062 Wheezing: Secondary | ICD-10-CM

## 2015-05-27 MED ORDER — LEVALBUTEROL TARTRATE 45 MCG/ACT IN AERO
1.0000 | INHALATION_SPRAY | RESPIRATORY_TRACT | Status: AC | PRN
Start: 2015-05-27 — End: 2016-05-26

## 2015-05-27 MED ORDER — ALBUTEROL-IPRATROPIUM 2.5-0.5 (3) MG/3ML IN SOLN
3.0000 mL | Freq: Once | RESPIRATORY_TRACT | Status: AC
Start: 2015-05-27 — End: 2015-05-27
  Administered 2015-05-27: 3 mL via RESPIRATORY_TRACT

## 2015-05-27 MED ORDER — IPRATROPIUM BROMIDE HFA 17 MCG/ACT IN AERS
2.0000 | INHALATION_SPRAY | Freq: Four times a day (QID) | RESPIRATORY_TRACT | Status: AC
Start: 2015-05-27 — End: 2016-05-26

## 2015-05-27 NOTE — Patient Instructions (Signed)
Asthma (Adult)  Asthma is a disease where the medium and small air passages within the lung go into spasm and restrict the flow of air. Inflammation and swelling of the airways cause further restriction. During an acute asthma attack, these factors cause difficulty breathing, wheezing, cough and chest tightness.    An asthma attack can be triggered by many things. Common triggers include infections such as the common cold, bronchitis, pneumonia. Irritants such as smoke or pollutants in the air, emotional upset, and exercise can also trigger an attack. Inmany adults with asthma, allergies todust, mold, pollen and animal dander can cause an asthma attack. Skipping doses of daily asthma medicine can also bring on an asthma attack.  Asthma can be controlled using theproper medicines prescribed by your healthcare provider and avoiding exposure to known triggers including allergens and irritants.  Home care   Take prescribed medicine exactly at the times advised. If you need medicine such as from a hand held inhaler or aerosol breathing machine more than every 4 hours, contact your healthcare provider or seek immediate medical attention. If prescribed an antibiotic or prednisone, take all of the medicine as prescribed, even if you are feeling better after a few days.   Do not smoke. Avoid being exposed to the smoke of others.   Some people with asthma have worsening of their symptoms when they take aspirin and non-steroidal or fever-reducing medicines like ibuprofen and naproxen. Talk to your healthcare provider if you think this may apply to you.  Follow-up care  Follow up with your healthcare provider, or as advised. Always bring all of your current medicines to any appointments with your healthcare provider. Also bring a complete list of medications eventhose not taken for asthma. If you do not already have one, talk to your healthcare provider about developing a personalized "Asthma Action Plan."  A  pneumococcal (pneumonia)vaccine and yearly flu shot (every fall) are recommended. Ask your doctor about this.  When to seek medical advice  Call your healthcare provider right away if any of these occur:   Increased wheezing or shortness of breath   Need to use your inhalers more often than usual without relief   Fever of 100.4F (38C) or higher, or as directed by your healthcare provider   Coughing up lots of dark-colored or bloody sputum (mucus)   Chest pain with each breath   If you use a peak flow meter as part of an Asthma Action Plan, and you are still in the yellow zone (50% to 80%) 15 minutes after using inhaler medicine.  Call 911  Call 911 if any of the following occur   Trouble walking or talking because of shortness of breath   If you use a peak flow meter as part of an Asthma Action Plan andyou are still in the red zone (less than 50%) 15 minutes after using inhaler medicine   Lips or fingernails turning gray or blue  Date Last Reviewed: 01/20/2014   2000-2016 [employer] [address], [place] [address]. All rights reserved. This information is not intended as a substitute for professional medical care. Always follow your healthcare professional's instructions.

## 2015-05-27 NOTE — Progress Notes (Signed)
[facility]  PROGRESS NOTE     Patient: Monique Willis   Date: 05/27/2015   MRN: [id number]       Monique Willis is a 70 y.o. female      SUBJECTIVE     Chief Complaint   Patient presents with   . Cough     Onset was 05/25/15. Cough ,wheezing, and brown phlem.         HPI Comments: States that cough wheezing over a week. Hx that albuterol/ does not help his wheezing and she is allergic to steroids.     Cough  This is a new problem. The current episode started in the past 7 days. The problem has been gradually worsening. The problem occurs constantly. The cough is productive of sputum. Associated symptoms include nasal congestion, postnasal drip, rhinorrhea, shortness of breath and wheezing. Pertinent negatives include no chest pain, chills or sore throat. Exacerbated by: gerd. She has tried a beta-agonist inhaler for the symptoms. The treatment provided no relief. Her past medical history is significant for asthma.       Review of Systems   Constitutional: Negative for chills.   HENT: Positive for postnasal drip and rhinorrhea. Negative for sore throat.    Respiratory: Positive for cough, shortness of breath and wheezing.    Cardiovascular: Negative for chest pain.       The following portions of the patient's history were reviewed and updated as appropriate: Allergies, Current Medications, Past Family History, Past Medical history, Past social history, Past surgical history, and Problem List.    OBJECTIVE     Vitals   Filed Vitals:    05/27/15 1151   BP: 167/85   Pulse: 95   Temp: 98.6 F (37 C)   TempSrc: Oral   Resp: 16   Height: 1.588 m (5' 2.5")   Weight: 56.7 kg (125 lb)   SpO2: 99%       Physical Exam   Constitutional: She is oriented to person, place, and time. She appears well-developed.   HENT:   Head: Normocephalic.   Eyes: Pupils are equal, round, and reactive to light.   Neck: Normal range of motion.   Cardiovascular: Normal rate.    Pulmonary/Chest: Effort normal. No respiratory distress. She has  wheezes. She has no rales. She exhibits no tenderness.   Musculoskeletal: Normal range of motion.   Lymphadenopathy:     She has no cervical adenopathy.   Neurological: She is alert and oriented to person, place, and time.       Lab Results (24 Hour)   Results     ** No results found for the last 24 hours. **          Radiology Results (24 Hour)     ** No results found for the last 24 hours. **          ASSESSMENT     Encounter Diagnosis   Name Primary?   . Wheeze Yes          PLAN     Procedures    1. Wheeze  - albuterol-ipratropium (DUO-NEB) 2.5-0.5(3) mg/3 mL nebulizer 3 mL; Take 3 mLs by nebulization one time.    will xopenex, along with ipatropium q 4 hrs   If no improvement she probably f/u with pulmonologist.      An After Visit Summary was printed and given to the patient.      Signed,  Greer Pickerel, MD  05/27/2015

## 2015-10-03 ENCOUNTER — Other Ambulatory Visit: Payer: Self-pay | Admitting: Obstetrics & Gynecology

## 2015-10-03 DIAGNOSIS — N63 Unspecified lump in unspecified breast: Secondary | ICD-10-CM

## 2015-10-04 ENCOUNTER — Ambulatory Visit
Admission: RE | Admit: 2015-10-04 | Discharge: 2015-10-04 | Disposition: A | Discharge status: Home / Self Care | Payer: Medicare Other | Source: Ambulatory Visit | Attending: Obstetrics & Gynecology | Admitting: Obstetrics & Gynecology

## 2015-10-04 ENCOUNTER — Other Ambulatory Visit: Payer: Self-pay | Admitting: Obstetrics & Gynecology

## 2015-10-04 DIAGNOSIS — N63 Unspecified lump in unspecified breast: Secondary | ICD-10-CM

## 2016-08-23 ENCOUNTER — Other Ambulatory Visit: Payer: Self-pay | Admitting: Obstetrics & Gynecology

## 2016-08-23 DIAGNOSIS — Z1231 Encounter for screening mammogram for malignant neoplasm of breast: Secondary | ICD-10-CM

## 2016-10-04 ENCOUNTER — Ambulatory Visit
Admission: RE | Admit: 2016-10-04 | Discharge: 2016-10-04 | Disposition: A | Discharge status: Home / Self Care | Payer: Medicare Other | Source: Ambulatory Visit | Attending: Obstetrics & Gynecology | Admitting: Obstetrics & Gynecology

## 2016-10-04 DIAGNOSIS — Z1231 Encounter for screening mammogram for malignant neoplasm of breast: Secondary | ICD-10-CM

## 2017-08-27 ENCOUNTER — Other Ambulatory Visit: Payer: Self-pay | Admitting: Obstetrics & Gynecology

## 2017-08-27 DIAGNOSIS — Z1231 Encounter for screening mammogram for malignant neoplasm of breast: Secondary | ICD-10-CM

## 2017-10-17 ENCOUNTER — Ambulatory Visit
Admission: RE | Admit: 2017-10-17 | Discharge: 2017-10-17 | Disposition: A | Discharge status: Home / Self Care | Payer: Medicare Other | Source: Ambulatory Visit | Attending: Obstetrics & Gynecology | Admitting: Obstetrics & Gynecology

## 2017-10-17 DIAGNOSIS — Z1231 Encounter for screening mammogram for malignant neoplasm of breast: Secondary | ICD-10-CM

## 2018-09-10 ENCOUNTER — Other Ambulatory Visit: Payer: Self-pay | Admitting: Obstetrics & Gynecology

## 2018-09-10 DIAGNOSIS — Z1231 Encounter for screening mammogram for malignant neoplasm of breast: Secondary | ICD-10-CM

## 2018-10-24 ENCOUNTER — Ambulatory Visit
Admission: RE | Admit: 2018-10-24 | Discharge: 2018-10-24 | Disposition: A | Discharge status: Home / Self Care | Payer: Medicare Other | Source: Ambulatory Visit | Attending: Obstetrics & Gynecology | Admitting: Obstetrics & Gynecology

## 2018-10-24 ENCOUNTER — Other Ambulatory Visit: Payer: Self-pay

## 2018-10-24 DIAGNOSIS — Z1231 Encounter for screening mammogram for malignant neoplasm of breast: Secondary | ICD-10-CM

## 2019-09-15 ENCOUNTER — Other Ambulatory Visit: Payer: Self-pay | Admitting: Obstetrics & Gynecology

## 2019-09-15 DIAGNOSIS — Z1231 Encounter for screening mammogram for malignant neoplasm of breast: Secondary | ICD-10-CM

## 2019-10-27 ENCOUNTER — Other Ambulatory Visit: Payer: Self-pay

## 2019-10-27 ENCOUNTER — Ambulatory Visit
Admission: RE | Admit: 2019-10-27 | Discharge: 2019-10-27 | Disposition: A | Discharge status: Home / Self Care | Payer: Medicare Other | Source: Ambulatory Visit | Attending: Obstetrics & Gynecology | Admitting: Obstetrics & Gynecology

## 2019-10-27 DIAGNOSIS — Z1231 Encounter for screening mammogram for malignant neoplasm of breast: Secondary | ICD-10-CM

## 2020-03-03 ENCOUNTER — Telehealth: Payer: Self-pay | Admitting: Internal Medicine

## 2020-03-03 NOTE — Telephone Encounter (Addendum)
Patient seen at visit with her sister Colon recall 05/2020 needs to be entered she is interested in repeating 1 screening colonoscopy but wants to wait till spring/summer Last colonoscopy was 2007  >74 but fit

## 2020-04-29 ENCOUNTER — Ambulatory Visit: Payer: Medicare Other | Admitting: Podiatry

## 2020-05-02 ENCOUNTER — Other Ambulatory Visit: Payer: Self-pay

## 2020-05-02 ENCOUNTER — Ambulatory Visit (INDEPENDENT_AMBULATORY_CARE_PROVIDER_SITE_OTHER): Payer: Medicare Other | Admitting: Podiatry

## 2020-05-02 DIAGNOSIS — M21619 Bunion of unspecified foot: Secondary | ICD-10-CM

## 2020-05-02 DIAGNOSIS — Q828 Other specified congenital malformations of skin: Secondary | ICD-10-CM | POA: Diagnosis not present

## 2020-05-02 DIAGNOSIS — M21621 Bunionette of right foot: Secondary | ICD-10-CM

## 2020-05-04 NOTE — Progress Notes (Signed)
Subjective:   Patient ID: Anna Price, female   DOB: 75 y.o.   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  Patient does not smoke likes to be active   Review of Systems  All other systems reviewed and are negative.       Objective:  Physical Exam Vitals and nursing note reviewed.  Constitutional:      Appearance: She is well-developed.  Pulmonary:     Effort: Pulmonary effort is normal.  Musculoskeletal:        General: Normal range of motion.  Skin:    General: Skin is warm.  Neurological:     Mental Status: She is alert.     Neurovascular status found to be intact muscle strength was found to be adequate range of motion adequate with patient noted to have multiple small nucleated lesions underneath the metatarsal and distal right and left foot that are painful and had previous treatment.  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  Today I did review and then plucked these lesions out and I gave instructions on the fact this can be done as needed.  I do not recommend structural correction deformity and orthotics I do not think will take a lot of pressure as these lesions are so distal.  All education rendered to patient today

## 2020-09-16 ENCOUNTER — Other Ambulatory Visit: Payer: Self-pay | Admitting: Obstetrics & Gynecology

## 2020-09-16 DIAGNOSIS — Z1231 Encounter for screening mammogram for malignant neoplasm of breast: Secondary | ICD-10-CM

## 2020-10-25 ENCOUNTER — Encounter: Payer: Self-pay | Admitting: Internal Medicine

## 2020-11-08 ENCOUNTER — Ambulatory Visit
Admission: RE | Admit: 2020-11-08 | Discharge: 2020-11-08 | Disposition: A | Discharge status: Home / Self Care | Payer: Medicare Other | Source: Ambulatory Visit | Attending: Obstetrics & Gynecology | Admitting: Obstetrics & Gynecology

## 2020-11-08 ENCOUNTER — Other Ambulatory Visit: Payer: Self-pay

## 2020-11-08 DIAGNOSIS — Z1231 Encounter for screening mammogram for malignant neoplasm of breast: Secondary | ICD-10-CM

## 2020-11-25 ENCOUNTER — Other Ambulatory Visit: Payer: Self-pay

## 2020-11-25 ENCOUNTER — Other Ambulatory Visit (HOSPITAL_BASED_OUTPATIENT_CLINIC_OR_DEPARTMENT_OTHER): Payer: Self-pay

## 2020-11-25 ENCOUNTER — Ambulatory Visit: Payer: Medicare Other | Attending: Internal Medicine

## 2020-11-25 DIAGNOSIS — Z23 Encounter for immunization: Secondary | ICD-10-CM

## 2020-11-25 MED ORDER — PFIZER COVID-19 VAC BIVALENT 30 MCG/0.3ML IM SUSP
INTRAMUSCULAR | 0 refills | Status: DC
  Filled 2020-11-25: qty 0.3, 1d supply, fill #0

## 2020-11-25 NOTE — Progress Notes (Signed)
   Covid-19 Vaccination Clinic  Name:  TULEEN MANDELBAUM    MRN: [id number] DOB: 1945-09-27  11/25/2020  Ms. Ziegler was observed post Covid-19 immunization for 30 minutes based on pre-vaccination screening without incident. She was provided with Vaccine Information Sheet and instruction to access the V-Safe system.   Ms. Shilling was instructed to call 911 with any severe reactions post vaccine: Difficulty breathing  Swelling of face and throat  A fast heartbeat  A bad rash all over body  Dizziness and weakness

## 2020-12-15 ENCOUNTER — Ambulatory Visit: Payer: Medicare Other

## 2021-01-23 ENCOUNTER — Telehealth: Payer: Self-pay | Admitting: Internal Medicine

## 2021-01-23 NOTE — Telephone Encounter (Signed)
Pt stated that her acid reflux is continuing to get worse; Pt states that she is monitoring everything she eats and drinks and still having reflux and feels that there is a lump in her throat. Pt scheduled for an OV on 01/26/2021 With colleen Berniece Pap. @ 1:30 Pt made aware.   Pt verbalized understanding with all questions answered.

## 2021-01-23 NOTE — Telephone Encounter (Signed)
Patient has an appointment 02/22/21 with Dr. Carlean Purl.  She is having difficulty with her acid reflux and it is worsening instead of getting better.  Patient requests a sooner appointment with the doctor.  Please call patient and let her know if you have anything sooner or if there is something she can do in the meantime.  Only call her home number.  If you cannot reach her, it is OK to leave a detailed voice message.  Thank you.

## 2021-01-25 ENCOUNTER — Other Ambulatory Visit: Payer: Self-pay

## 2021-01-25 DIAGNOSIS — E7800 Pure hypercholesterolemia, unspecified: Secondary | ICD-10-CM | POA: Insufficient documentation

## 2021-01-26 ENCOUNTER — Other Ambulatory Visit (INDEPENDENT_AMBULATORY_CARE_PROVIDER_SITE_OTHER): Payer: Medicare Other

## 2021-01-26 ENCOUNTER — Ambulatory Visit (INDEPENDENT_AMBULATORY_CARE_PROVIDER_SITE_OTHER): Payer: Medicare Other | Admitting: Nurse Practitioner

## 2021-01-26 ENCOUNTER — Encounter: Payer: Self-pay | Admitting: Nurse Practitioner

## 2021-01-26 VITALS — BP 144/78 | HR 100 | Ht 61.5 in | Wt 110.4 lb

## 2021-01-26 DIAGNOSIS — K219 Gastro-esophageal reflux disease without esophagitis: Secondary | ICD-10-CM | POA: Diagnosis not present

## 2021-01-26 DIAGNOSIS — R09A2 Foreign body sensation, throat: Secondary | ICD-10-CM

## 2021-01-26 DIAGNOSIS — R1110 Vomiting, unspecified: Secondary | ICD-10-CM

## 2021-01-26 DIAGNOSIS — R0989 Other specified symptoms and signs involving the circulatory and respiratory systems: Secondary | ICD-10-CM

## 2021-01-26 LAB — CBC WITH DIFFERENTIAL/PLATELET
Basophils Absolute: 0.1 10*3/uL (ref 0.0–0.1)
Basophils Relative: 0.9 % (ref 0.0–3.0)
Eosinophils Absolute: 0.1 10*3/uL (ref 0.0–0.7)
Eosinophils Relative: 1.3 % (ref 0.0–5.0)
HCT: 41.6 % (ref 36.0–46.0)
Hemoglobin: 13.9 g/dL (ref 12.0–15.0)
Lymphocytes Relative: 31.6 % (ref 12.0–46.0)
Lymphs Abs: 2 10*3/uL (ref 0.7–4.0)
MCHC: 33.4 g/dL (ref 30.0–36.0)
MCV: 92 fl (ref 78.0–100.0)
Monocytes Absolute: 0.5 10*3/uL (ref 0.1–1.0)
Monocytes Relative: 8.2 % (ref 3.0–12.0)
Neutro Abs: 3.6 10*3/uL (ref 1.4–7.7)
Neutrophils Relative %: 58 % (ref 43.0–77.0)
Platelets: 260 10*3/uL (ref 150.0–400.0)
RBC: 4.52 Mil/uL (ref 3.87–5.11)
RDW: 13.3 % (ref 11.5–15.5)
WBC: 6.2 10*3/uL (ref 4.0–10.5)

## 2021-01-26 LAB — COMPREHENSIVE METABOLIC PANEL
ALT: 14 U/L (ref 0–35)
AST: 17 U/L (ref 0–37)
Albumin: 4.6 g/dL (ref 3.5–5.2)
Alkaline Phosphatase: 93 U/L (ref 39–117)
BUN: 17 mg/dL (ref 6–23)
CO2: 30 mEq/L (ref 19–32)
Calcium: 9.8 mg/dL (ref 8.4–10.5)
Chloride: 101 mEq/L (ref 96–112)
Creatinine, Ser: 0.7 mg/dL (ref 0.40–1.20)
GFR: 83.91 mL/min (ref >60.00)
Glucose, Bld: 83 mg/dL (ref 70–99)
Potassium: 3.8 mEq/L (ref 3.5–5.1)
Sodium: 139 mEq/L (ref 135–145)
Total Bilirubin: 0.5 mg/dL (ref 0.2–1.2)
Total Protein: 7.5 g/dL (ref 6.0–8.3)

## 2021-01-26 MED ORDER — FAMOTIDINE 20 MG PO TABS: 20.0000 mg | ORAL_TABLET | Freq: Two times a day (BID) | ORAL | 2 refills | Status: DC

## 2021-01-26 NOTE — Progress Notes (Signed)
.    01/29/2021 KEALANI LECKEY [id number] 1945-12-11   CHIEF COMPLAINT: regurgitation, increased burping   HISTORY OF PRESENT ILLNESS:  Arlie Posch is a 75 year old female with a past medical history of hyperlipidemia, GERD, Barrett's esophagus 2003, hiatal hernia and [facility] surgery multiple.   She was seen in our office by Dr. Carlean Purl 08/25/2014 for further evaluation regarding globus sensation, GERD and Barrett's esophagus follow-up.  Her most recent EGD 09/03/2014 showed a 3 to 4 cm hiatal hernia, no Barrett changes in the distal esophagus/GE junction were identified  and fundic gland polyps (proved by prior biopsies) were noted.  Consideration to repeat a colonoscopy in 2021 was also discussed.  She presents to our office today with complains of experiencing regurgitation of food and sometimes with liquids which occurs once daily for the past 2 months. She has noticed increased burping as well. Frequent throat clearing. No heartburn or dysphagia. She feels like there is a lump in her throat which is constant and is similar to the globus sensation she had in 2016. Her past globus sensation abated after she weaned of PPI.  No upper or lower abdominal pain. She has lost 4 to 5 lbs over the past 2 months. No fevers or night sweats.  She is concerned about her risk of esophageal cancer with her prior history of Barrett's esophagus and her father had Barrett's esophagus and her mother possible had cancer at the GE junction. She feels something hard to her lower chest. She points to her xyphoid process and I reassured her that the bony prominence of her xyphoid process was normal anatomy. She is passing a normal formed soft stool twice daily. She had diarrhea when on PPIs in the past. She underwent a colonoscopy by Dr. Algis Greenhouse in 2007 which showed diverticulosis, no polyps. She has not wished to purse any further colonoscopies since then.   EGD 09/03/2014: 1) 3-4 cm hiatal hernia 2) Fundic gland polyps  of proximal stomach (prior biopsies proved this) 3) Otherwise normal EGD - no strictures, no Barrett's changes in the distal esophagus/GE junction area Would not do further routine EGD see me as needed  EGD 11/04/2012: Hiatal hernia The esophagus was dilated  Colonoscopy 01/22/2006 by Dr. 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 Retired. She has 2 sons. Nonsmoker. She drinks one glass of wine or a mixed cocktail  once daily or less. No drug use.    Family History: Father had Barrett's esophagus and CHF, possibly had Parkinson's disease.  Mother possiibly had cancer at the GE junction. Sister  had breast cancer. No family history of colon cancer.    Allergies  Allergen Reactions   Atropine    Codeine    Demerol [Meperidine]    Erythromycin    Morphine Nausea And Vomiting   Nexium [Esomeprazole Magnesium] Other (See Comments)    Violent headache   Oxycodone-Acetaminophen Nausea And Vomiting   Phenergan [Promethazine Hcl] Other (See Comments)    Rectal pain   Cortisone Rash   Influenza Virus Vaccine Rash   Prednisone Rash   Sulfa Antibiotics Rash     Outpatient Encounter Medications as of 01/26/2021  Medication Sig   [DISCONTINUED] famotidine  (PEPCID) 20 MG tablet Take 1 tablet (20 mg total) by mouth 2 (two) times daily.   [DISCONTINUED] RABEprazole (ACIPHEX) 20 MG tablet Take 1 tablet (20 mg total) by mouth daily. (Patient not taking: Reported on 12/22/2014)   Facility-Administered Encounter Medications as of 01/26/2021  Medication   albuterol (PROVENTIL) (2.5 MG/3ML) 0.083% nebulizer solution 2.5 mg    REVIEW OF SYSTEMS:  Gen: Denies fever, sweats or chills. No weight loss.  CV: Denies chest pain, palpitations or edema. Resp: Denies cough, shortness of breath of hemoptysis.  GI: See HPI.  GU : Denies urinary burning, blood in urine, increased urinary frequency or incontinence. MS: Denies joint pain, muscles aches or weakness. Derm: Denies rash, itchiness, skin lesions or unhealing ulcers. Psych: Denies depression, anxiety or memory loss.  Heme: Denies bruising, bleeding. Neuro:  Denies headaches, dizziness or paresthesias. Endo:  Denies any problems with DM, thyroid or adrenal function.  PHYSICAL EXAM: BP (!) 144/78   Pulse 100   Ht 5' 1.5" (1.562 m)   Wt 110 lb 6 oz (50.1 kg)   BMI 20.52 kg/m  General: 75 year old female in NAD.  Head: Normocephalic and atraumatic. Eyes:  Sclerae non-icteric, conjunctive pink. Ears: Normal auditory acuity. Mouth: Dentition intact. No ulcers or lesions.  Neck: Supple, no lymphadenopathy or thyromegaly.  Lungs: Clear bilaterally to auscultation without wheezes, crackles or rhonchi. Chest: Normal sternum and xyphoid process without deformity.  Heart: Regular rate and rhythm. No murmur, rub or gallop appreciated.  Abdomen: Soft, nontender, non distended. No masses. No hepatosplenomegaly. Normoactive bowel sounds x 4 quadrants.  Rectal: Deferred.  Musculoskeletal: Symmetrical with no gross deformities. Skin: Warm and dry. No rash or lesions on visible extremities. Extremities: No edema. Neurological: Alert oriented x 4, no focal deficits.  Psychological:  Alert and cooperative.  Normal mood and affect.  ASSESSMENT AND PLAN:  40) 75 year old female with a history of GERD, hiatal hernia and remote history of Barrett's esophagus with regurgitation, globus sensation and frequent throat clearing. EGD 08/24/2014 showed a 3-4 cm hiatal hernia, fundic gland polyps and no evidence of Barrett's esophagus. -Barium swallow (not tablet per the patient's request, nearly choked on barium tablet when esophagram done in 2016) -Await barium swallow results, defer decision regarding future EGD to Dr. Carlean Purl -Famotidine 20mg  po bid  -Recommended ENT consult, laryngoscopy secondary to recurrent globus sensation and frequent throat clearing  -CBC, CMMP -Patient to contact office if symptoms worsen   2) Colon cancer screening. Colonoscopy 2007 showed left sided diverticulosis, no polyps  -Patient does not wish to pursue a colonoscopy at this time, she will discuss further with Dr. Carlean Purl at the time of her scheduled appointment 02/23/2021  3) Weight loss, 4 to 5 lbs past 2 months  -Chest/abd/pelvic CT if weight loss persists     CC:  Vania Rea, MD

## 2021-01-26 NOTE — Patient Instructions (Addendum)
IMAGING: You will be contacted by [facility] (Your caller ID will indicate phone # [phone]) in the next 7 days to schedule your Swallow test. If you have not heard from them within 7 business days, please call [facility] at [phone] to follow up on the status of your appointment.    LABS:  Lab work has been ordered for you today. Our lab is located in the basement. Press "B" on the elevator. The lab is located at the first door on the left as you exit the elevator.  MEDICATION: We have sent the following medication to your pharmacy for you to pick up at your convenience: Famotidine 20 MG tablet, take 1 tablet twice a day.  RECOMMENDATIONS: Follow up with ENT regarding the globus sensation and frequent throat clearing.  Keep your follow up appointment with Dr. Carlean Purl on 02/22/21 at 9:50 am

## 2021-01-27 ENCOUNTER — Other Ambulatory Visit: Payer: Self-pay

## 2021-01-27 ENCOUNTER — Telehealth: Payer: Self-pay | Admitting: Nurse Practitioner

## 2021-01-27 MED ORDER — FAMOTIDINE 20 MG PO TABS: 20.0000 mg | ORAL_TABLET | Freq: Two times a day (BID) | ORAL | 2 refills | Status: DC

## 2021-01-27 NOTE — Telephone Encounter (Signed)
[facility] Can you please clarify what [facility] location she wants? Thank you.

## 2021-01-27 NOTE — Telephone Encounter (Signed)
[facility] [place]

## 2021-01-27 NOTE — Telephone Encounter (Signed)
Error

## 2021-01-27 NOTE — Telephone Encounter (Signed)
Patient called requesting we resend the [facility] [phone].

## 2021-02-07 ENCOUNTER — Other Ambulatory Visit: Payer: Self-pay

## 2021-02-07 ENCOUNTER — Ambulatory Visit (HOSPITAL_COMMUNITY)
Admission: RE | Admit: 2021-02-07 | Discharge: 2021-02-07 | Disposition: A | Discharge status: Home / Self Care | Payer: Medicare Other | Source: Ambulatory Visit | Attending: Nurse Practitioner | Admitting: Nurse Practitioner

## 2021-02-07 DIAGNOSIS — K219 Gastro-esophageal reflux disease without esophagitis: Secondary | ICD-10-CM | POA: Insufficient documentation

## 2021-02-07 DIAGNOSIS — R1110 Vomiting, unspecified: Secondary | ICD-10-CM | POA: Diagnosis not present

## 2021-02-07 DIAGNOSIS — R0989 Other specified symptoms and signs involving the circulatory and respiratory systems: Secondary | ICD-10-CM | POA: Insufficient documentation

## 2021-02-22 ENCOUNTER — Ambulatory Visit (INDEPENDENT_AMBULATORY_CARE_PROVIDER_SITE_OTHER): Payer: Medicare Other | Admitting: Internal Medicine

## 2021-02-22 ENCOUNTER — Encounter: Payer: Self-pay | Admitting: Internal Medicine

## 2021-02-22 VITALS — BP 140/80 | HR 96 | Ht 61.25 in | Wt 110.1 lb

## 2021-02-22 DIAGNOSIS — R0989 Other specified symptoms and signs involving the circulatory and respiratory systems: Secondary | ICD-10-CM

## 2021-02-22 DIAGNOSIS — Z1211 Encounter for screening for malignant neoplasm of colon: Secondary | ICD-10-CM

## 2021-02-22 DIAGNOSIS — Z8719 Personal history of other diseases of the digestive system: Secondary | ICD-10-CM | POA: Diagnosis not present

## 2021-02-22 NOTE — Progress Notes (Signed)
CYARA DEVOTO 76 y.o. 05/13/1945 [id number]  Assessment & Plan:   Encounter Diagnoses  Name Primary?   Globus sensation Yes   History of Barrett's esophagus?    Colon cancer screening      We talked about how stress and anxiety are a common cause of globus.  She does not have overt dysphagia except to very large pills perhaps.  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  We have decided to proceed with an EGD with possible dilation of the UES and colonoscopy for screening.  She wants to wait until her back is improved so she is instructed to contact us when she is ready to schedule the procedures within a few months.  CC: Vania Rea, MD   Subjective:   Chief Complaint: Globus sensation  HPI 76 year old white woman with a history of intermittent globus years ago recurrent again saw [facility] the other month and had a barium swallow that was normal.  She still has the globus issues.  She did not or could not swallow the tablets with the barium swallow.  No mention in the report that it was attempted.  She does have a fair amount of stress in her life she says and brings that up wondering whether that might be causing her globus symptoms.  She has a history of possible Barrett's esophagus upper endoscopy a number of years ago in [place] showed intestinal metaplasia and she was given that diagnosis though when I performed EGD in 2016 I did not see changes of Barrett's esophagus though she had fundic gland polyps and a hiatal hernia. She is a bit overdue for a screening colonoscopy though her age is increasing she is inclined to have 1.  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  Has used a lot of Advil at one point to backing off on that and undertaking physical therapy with benefit.  Wt Readings from  Last 3 Encounters:  02/22/21 110 lb 2 oz (50 kg)  01/26/21 110 lb 6 oz (50.1 kg)  01/19/15 121 lb 4.1 oz (55 kg)    Allergies  Allergen Reactions   Atropine    Codeine    Demerol [Meperidine]    Erythromycin    Morphine Nausea And Vomiting   Nexium [Esomeprazole Magnesium] Other (See Comments)    Violent headache   Oxycodone-Acetaminophen Nausea And Vomiting   Phenergan [Promethazine Hcl] Other (See Comments)    Rectal pain   Cortisone Rash   Influenza Virus Vaccine Rash   Prednisone Rash   Sulfa Antibiotics Rash   No outpatient medications have been marked as taking for the 02/22/21 encounter (Office Visit) with Gatha Mayer, MD.   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   Chronic throat clearing 09/03/2014   Diverticulosis    Duodenitis    chronic   Fundic gland polyps of stomach, benign    GERD (gastroesophageal reflux disease)    Heart murmur    Heart palpitations    slow  heart valve leak not thought to be a problem   Hiatal hernia    Hyperlipemia    Lumbar spondylosis    poris defect   Pectus excavatum    UTI (lower urinary tract infection)    Past Surgical History:  Procedure Laterality Date   COLONOSCOPY     DILATION AND CURETTAGE OF UTERUS     ESOPHAGOGASTRODUODENOSCOPY     SHOULDER SURGERY     TONSILLECTOMY     Social History   Social History Narrative   Married   2 sons 1 is Paediatric nurse and daughter-in-law rheumatologist in [place]   Retired Pharmacist, hospital   1 cup coffee qd   08/25/2014   family history includes Breast cancer in her paternal aunt and sister; Congestive Heart Failure in her father; Esophageal cancer in an other  family member; Parkinsonism in her father; Stroke in her mother.   Review of Systems As above  Objective:   Physical Exam BP 140/80 (BP Location: Left Arm, Patient Position: Sitting, Cuff Size: Normal)    Pulse 96    Ht 5' 1.25" (1.556 m) Comment: height measured without shoes   Wt 110 lb 2 oz (50 kg)    BMI 20.64 kg/m

## 2021-02-22 NOTE — Patient Instructions (Signed)
It has been recommended to you by your physician that you have a(n) EGD & colonoscopy completed. Per your request, we did not schedule the procedure(s) today. Please contact our office at [phone] should you decide to have the procedure completed. You will be scheduled for a pre-visit and procedure at that time.   I appreciate the opportunity to care for you. Silvano Rusk, MD, [facility]

## 2021-02-23 ENCOUNTER — Encounter: Payer: Self-pay | Admitting: Internal Medicine

## 2021-09-29 ENCOUNTER — Other Ambulatory Visit: Payer: Self-pay | Admitting: Obstetrics & Gynecology

## 2021-09-29 DIAGNOSIS — Z1231 Encounter for screening mammogram for malignant neoplasm of breast: Secondary | ICD-10-CM

## 2021-10-17 ENCOUNTER — Encounter: Payer: Self-pay | Admitting: Internal Medicine

## 2021-11-09 ENCOUNTER — Ambulatory Visit
Admission: RE | Admit: 2021-11-09 | Discharge: 2021-11-09 | Disposition: A | Discharge status: Home / Self Care | Payer: Medicare Other | Source: Ambulatory Visit | Attending: Obstetrics & Gynecology | Admitting: Obstetrics & Gynecology

## 2021-11-09 DIAGNOSIS — Z1231 Encounter for screening mammogram for malignant neoplasm of breast: Secondary | ICD-10-CM

## 2021-11-13 ENCOUNTER — Other Ambulatory Visit: Payer: Self-pay | Admitting: Obstetrics & Gynecology

## 2021-11-13 DIAGNOSIS — R928 Other abnormal and inconclusive findings on diagnostic imaging of breast: Secondary | ICD-10-CM

## 2021-11-14 ENCOUNTER — Ambulatory Visit (AMBULATORY_SURGERY_CENTER): Payer: Medicare Other

## 2021-11-14 VITALS — Ht 61.25 in | Wt 110.0 lb

## 2021-11-14 DIAGNOSIS — K22719 Barrett's esophagus with dysplasia, unspecified: Secondary | ICD-10-CM

## 2021-11-14 DIAGNOSIS — Z1211 Encounter for screening for malignant neoplasm of colon: Secondary | ICD-10-CM

## 2021-11-14 NOTE — Progress Notes (Signed)
No egg or soy allergy known to patient  No issues known to pt with past sedation with any surgeries or procedures Patient denies ever being told they had issues or difficulty with intubation  No FH of Malignant Hyperthermia Pt is not on diet pills Pt is not on  home 02  Pt is not on blood thinners  Pt denies issues with constipation  No A fib or A flutter Have any cardiac testing pending--denied Pt instructed to use [email or web address] or GoodRx for a price reduction on prep   

## 2021-11-20 ENCOUNTER — Ambulatory Visit
Admission: RE | Admit: 2021-11-20 | Discharge: 2021-11-20 | Disposition: A | Discharge status: Home / Self Care | Payer: Medicare Other | Source: Ambulatory Visit | Attending: Obstetrics & Gynecology | Admitting: Obstetrics & Gynecology

## 2021-11-20 ENCOUNTER — Ambulatory Visit: Payer: Medicare Other

## 2021-11-20 DIAGNOSIS — R928 Other abnormal and inconclusive findings on diagnostic imaging of breast: Secondary | ICD-10-CM

## 2021-11-28 ENCOUNTER — Telehealth: Payer: Self-pay

## 2021-11-28 ENCOUNTER — Telehealth: Payer: Self-pay | Admitting: Internal Medicine

## 2021-11-28 NOTE — Telephone Encounter (Signed)
Pt called back and r/s her endocolon as it was and decided the gatorade/miralax prep would be ok for her to take.

## 2021-11-28 NOTE — Telephone Encounter (Signed)
Returned call. Voicemail.

## 2021-11-28 NOTE — Telephone Encounter (Signed)
pt states she is concerned with the prep, too much sugar or she cannot tolerate chloride and also has some health issues right now that she wants to postpone the procedures.  She states she will call back after the first of the year.

## 2021-11-28 NOTE — Telephone Encounter (Signed)
Pt called back after speaking with pharmacist again. She is now reassured about her pre-diabetes and the prep and wishes to continue as originally scheduled. Endo-Colon rescheduled for 10/18 at 2pm.

## 2021-11-28 NOTE — Telephone Encounter (Signed)
Inbound call from patient stating that she is doing  the Miralax split dose for her endoscopy and colonoscopy on 10/10 with Dr. Carlean Purl. Patient is requesting a call back to discuss getting a new prep medication. Please advise.

## 2021-12-02 ENCOUNTER — Encounter: Payer: Self-pay | Admitting: Certified Registered Nurse Anesthetist

## 2021-12-05 ENCOUNTER — Telehealth: Payer: Self-pay

## 2021-12-05 NOTE — Telephone Encounter (Signed)
Pt called quite anxious about proceeding with prep as she is already passing clear liquid. Reassured.

## 2021-12-06 ENCOUNTER — Encounter: Payer: Medicare Other | Admitting: Internal Medicine

## 2021-12-06 ENCOUNTER — Encounter: Payer: Self-pay | Admitting: Internal Medicine

## 2021-12-06 ENCOUNTER — Ambulatory Visit (AMBULATORY_SURGERY_CENTER): Payer: Medicare Other | Admitting: Internal Medicine

## 2021-12-06 VITALS — BP 122/62 | HR 74 | Temp 98.7°F | Resp 11 | Ht 61.25 in | Wt 110.0 lb

## 2021-12-06 DIAGNOSIS — D120 Benign neoplasm of cecum: Secondary | ICD-10-CM

## 2021-12-06 DIAGNOSIS — R09A2 Foreign body sensation, throat: Secondary | ICD-10-CM

## 2021-12-06 DIAGNOSIS — K635 Polyp of colon: Secondary | ICD-10-CM | POA: Diagnosis not present

## 2021-12-06 DIAGNOSIS — D131 Benign neoplasm of stomach: Secondary | ICD-10-CM | POA: Diagnosis not present

## 2021-12-06 DIAGNOSIS — K449 Diaphragmatic hernia without obstruction or gangrene: Secondary | ICD-10-CM

## 2021-12-06 DIAGNOSIS — Z1211 Encounter for screening for malignant neoplasm of colon: Secondary | ICD-10-CM

## 2021-12-06 DIAGNOSIS — K219 Gastro-esophageal reflux disease without esophagitis: Secondary | ICD-10-CM

## 2021-12-06 MED ORDER — SODIUM CHLORIDE 0.9 % IV SOLN: 500.0000 mL | Freq: Once | INTRAVENOUS | Status: DC

## 2021-12-06 NOTE — Progress Notes (Signed)
1410 Robinul 0.1 mg IV given due large amount of secretions upon assessment.  MD made aware, vss

## 2021-12-06 NOTE — Progress Notes (Signed)
[facility] History and Physical   Primary Care Physician:  Kennieth Rad, MD   Reason for Procedure:   Globus, GERD and colon cancer screening  Plan:    EGD, colonoscopy     HPI: Anna Price is a 76 y.o. female here for evaluation of above. She does have history of some dysphagia to large pills. She says globus and the dysphagia are better.   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   Chronic throat clearing 09/03/2014   Diverticulosis    Duodenitis    chronic   Fundic gland polyps of stomach, benign    GERD (gastroesophageal reflux disease)    Heart murmur    Heart palpitations    slow heart valve leak not thought to be a problem   Hiatal hernia    Hyperlipemia    Lumbar spondylosis    poris defect   Pectus excavatum    UTI (lower urinary tract infection)     Past Surgical History:  Procedure Laterality Date   COLONOSCOPY     DILATION AND CURETTAGE OF UTERUS     ESOPHAGOGASTRODUODENOSCOPY     SHOULDER SURGERY     TONSILLECTOMY      Prior to Admission medications   Medication Sig Start Date End Date Taking? Authorizing Provider  NON FORMULARY Muro 128 ointment 5% nightly both eyes   Yes [provider]    Current Outpatient Medications  Medication Sig Dispense Refill   NON FORMULARY Muro 128 ointment 5% nightly both eyes     Current Facility-Administered Medications  Medication Dose Route Frequency Provider Last Rate Last Admin   0.9 %  sodium chloride infusion  500 mL Intravenous Once Gatha Mayer, MD        Allergies as of 12/06/2021 - Review Complete 12/06/2021  Allergen Reaction Noted   Atropine  08/09/2014   Codeine  08/09/2014   Demerol [meperidine]  08/09/2014   Erythromycin  08/09/2014   Morphine Nausea And Vomiting 01/25/2021   Nexium [esomeprazole magnesium] Other (See Comments)  08/09/2014   Oxycodone-acetaminophen Nausea And Vomiting 06/28/2017   Phenergan [promethazine hcl] Other (See Comments) 08/09/2014   Cortisone Rash 08/09/2014   Influenza virus vaccine Rash 05/27/2015   Prednisone Rash 08/09/2014   Sulfa antibiotics Rash 05/27/2015    Family History  Problem Relation Age of Onset   Stroke Mother        brain stem   Congestive Heart Failure Father    Parkinsonism Father    Breast cancer Sister        late 37s- early 75s   Stomach cancer Maternal Aunt    Breast cancer Paternal Aunt        late 51s   Esophageal cancer Other        aunt   Colon cancer Neg Hx    Colon polyps Neg Hx    Rectal cancer Neg Hx     Social History   Socioeconomic History   Marital status: Married    Spouse name: Not on file   Number of children: Not on file   Years of education: Not on file   Highest education level: Not on file  Occupational History   Occupation: retired  Tobacco Use   Smoking status: Never   Smokeless tobacco: Never  Substance and Sexual Activity  Alcohol use: Yes    Alcohol/week: 0.0 standard drinks of alcohol    Comment: 1-2 red wine daily   Drug use: No   Sexual activity: Not on file  Other Topics Concern   Not on file  Social History Narrative   Married   2 sons 1 is dermatologist and daughter-in-law rheumatologist in [place]   Retired Pharmacist, hospital   1 cup coffee qd      Social Determinants of Radio broadcast assistant Strain: Not on file  Food Insecurity: Not on file  Transportation Needs: Not on file  Physical Activity: Not on file  Stress: Not on file  Social Connections: Not on file  Intimate Partner Violence: Not on file    Review of Systems:  All other review of systems negative except as mentioned in the HPI.  Physical Exam: Vital signs BP 139/85   Pulse 99   Temp 98.7 F (37.1 C) (Skin)   Ht 5' 1.25" (1.556 m)   Wt 110 lb (49.9 kg)   SpO2 100%   BMI 20.61 kg/m   General:   Alert,  Well-developed,  well-nourished, pleasant and cooperative in NAD Lungs:  Clear throughout to auscultation.   Heart:  Regular rate and rhythm; no murmurs, clicks, rubs,  or gallops. Abdomen:  Soft, nontender and nondistended. Normal bowel sounds.   Neuro/Psych:  Alert and cooperative. Normal mood and affect. A and O x 3   '@Ayriana Wix'$  Simonne Maffucci, MD, [facility] [phone] (pager) 12/06/2021 2:07 PM@

## 2021-12-06 NOTE — Telephone Encounter (Signed)
Patient is requesting a call. Thank you

## 2021-12-06 NOTE — Telephone Encounter (Signed)
Returned pt's call. Prep went well overnight but she remains anxious VU:DTHYHO half.  Reassured. She will use Desitin cream as needed for skin irritation.

## 2021-12-06 NOTE — Patient Instructions (Addendum)
Esophagus was ok - did not need dilation. Stomach polyps seen again and so was small hiatal hernia.  I found one very tiny colon polyp and saw diverticulosis in the colon. Please read the handouts.  I think you suffer with IBS or Irritable Bowel Syndrome. Increasing soluble fiber may help the consistency of your stools. Examples are oats, apples, oranges, grapefruit, legumes (beans, lentils, peas) barley, bananas (ripe) and sweet potatoes.   I will let you know polyp pathology but am certain it is benign.  I appreciate the opportunity to care for you. Gatha Mayer, MD, [facility]  Handouts provided on polyps, diverticulosis, high-fiber diet and hiatal hernia.  Increase soluble fiber to help loose stools and IBS.  Continue present medications. Await pathology results.  No recommendation at this time regarding repeat colonoscopy. Do not anticipate routine repeat.    YOU HAD AN ENDOSCOPIC PROCEDURE TODAY AT [facility]:   Refer to the procedure report that was given to you for any specific questions about what was found during the examination.  If the procedure report does not answer your questions, please call your gastroenterologist to clarify.  If you requested that your care partner not be given the details of your procedure findings, then the procedure report has been included in a sealed envelope for you to review at your convenience later.  YOU SHOULD EXPECT: Some feelings of bloating in the abdomen. Passage of more gas than usual.  Walking can help get rid of the air that was put into your GI tract during the procedure and reduce the bloating. If you had a lower endoscopy (such as a colonoscopy or flexible sigmoidoscopy) you may notice spotting of blood in your stool or on the toilet paper. If you underwent a bowel prep for your procedure, you may not have a normal bowel movement for a few days.  Please Note:  You might notice some irritation and congestion in your nose or  some drainage.  This is from the oxygen used during your procedure.  There is no need for concern and it should clear up in a day or so.  SYMPTOMS TO REPORT IMMEDIATELY:  Following lower endoscopy (colonoscopy or flexible sigmoidoscopy):  Excessive amounts of blood in the stool  Significant tenderness or worsening of abdominal pains  Swelling of the abdomen that is new, acute  Fever of 100F or higher  Following upper endoscopy (EGD)  Vomiting of blood or coffee ground material  New chest pain or pain under the shoulder blades  Painful or persistently difficult swallowing  New shortness of breath  Fever of 100F or higher  Black, tarry-looking stools  For urgent or emergent issues, a gastroenterologist can be reached at any hour by calling [phone]. Do not use MyChart messaging for urgent concerns.    DIET:  We do recommend a small meal at first, but then you may proceed to your regular diet.  Drink plenty of fluids but you should avoid alcoholic beverages for 24 hours.  ACTIVITY:  You should plan to take it easy for the rest of today and you should NOT DRIVE or use heavy machinery until tomorrow (because of the sedation medicines used during the test).    FOLLOW UP: Our staff will call the number listed on your records the next business day following your procedure.  We will call around 7:15- 8:00 am to check on you and address any questions or concerns that you may have regarding the information given to  you following your procedure. If we do not reach you, we will leave a message.     If any biopsies were taken you will be contacted by phone or by letter within the next 1-3 weeks.  Please call us at [phone] if you have not heard about the biopsies in 3 weeks.    SIGNATURES/CONFIDENTIALITY: You and/or your care partner have signed paperwork which will be entered into your electronic medical record.  These signatures attest to the fact that that the information above on  your After Visit Summary has been reviewed and is understood.  Full responsibility of the confidentiality of this discharge information lies with you and/or your care-partner.

## 2021-12-06 NOTE — Progress Notes (Signed)
Report given to PACU, vss 

## 2021-12-06 NOTE — Progress Notes (Signed)
Pt's states no medical or surgical changes since previsit or office visit. Vs assessed by C.W

## 2021-12-06 NOTE — Op Note (Signed)
[name] Procedure Date: 12/06/2021 2:09 PM MRN: [id number] Endoscopist: Gatha Mayer , MD Age: 76 Referring MD:  Date of Birth: 09-04-1945 Gender: Female Account #: [id number] Procedure:                Colonoscopy Indications:              Screening for colorectal malignant neoplasm Medicines:                Monitored Anesthesia Care Procedure:                Pre-Anesthesia Assessment:                           - Prior to the procedure, a History and Physical                            was performed, and patient medications and                            allergies were reviewed. The patient's tolerance of                            previous anesthesia was also reviewed. The risks                            and benefits of the procedure and the sedation                            options and risks were discussed with the patient.                            All questions were answered, and informed consent                            was obtained. Prior Anticoagulants: The patient has                            taken no previous anticoagulant or antiplatelet                            agents. ASA Grade Assessment: II - A patient with                            mild systemic disease. After reviewing the risks                            and benefits, the patient was deemed in                            satisfactory condition to undergo the procedure.                           After obtaining informed consent, the colonoscope  was passed under direct vision. Throughout the                            procedure, the patient's blood pressure, pulse, and                            oxygen saturations were monitored continuously. The                            Olympus [id number]-[id number] (#[id number]) Colonoscope was                            introduced through the anus and advanced to the the                            cecum, identified  by appendiceal orifice and                            ileocecal valve. The colonoscopy was performed                            without difficulty. The patient tolerated the                            procedure well. The quality of the bowel                            preparation was good. The ileocecal valve,                            appendiceal orifice, and rectum were photographed. Scope In: 2:27:43 PM Scope Out: 2:44:06 PM Scope Withdrawal Time: 0 hours 14 minutes 0 seconds  Total Procedure Duration: 0 hours 16 minutes 23 seconds  Findings:                 The digital rectal exam findings include decreased                            sphincter tone.                           A 3 mm polyp was found in the cecum. The polyp was                            flat. The polyp was removed with a cold snare.                            Resection and retrieval were complete. Verification                            of patient identification for the specimen was                            done. Estimated blood loss was minimal.  Multiple small and large-mouthed diverticula were                            found in the entire colon.                           The exam was otherwise without abnormality on                            direct and retroflexion views. Complications:            No immediate complications. Estimated Blood Loss:     Estimated blood loss was minimal. Impression:               - Decreased sphincter tone found on digital rectal                            exam.                           - One 3 mm polyp in the cecum, removed with a cold                            snare. Resected and retrieved.                           - Diverticulosis in the entire examined colon.                           - The examination was otherwise normal on direct                            and retroflexion views. Recommendation:           - Patient has a contact number available  for                            emergencies. The signs and symptoms of potential                            delayed complications were discussed with the                            patient. Return to normal activities tomorrow.                            Written discharge instructions were provided to the                            patient.                           - Resume previous diet.                           - Continue present medications.                           -  No recommendation at this time regarding repeat                            colonoscopy. Do not anticipate routine repeat.                           - Await pathology results.                           - Increase soluble fiber to help loose stools and                            IBS (she c/o chronic loose stools and heightened                            gastrocolic reflex issues x many years today) Gatha Mayer, MD 12/06/2021 2:57:56 PM This report has been signed electronically.

## 2021-12-06 NOTE — Progress Notes (Signed)
Called to room to assist during endoscopic procedure.  Patient ID and intended procedure confirmed with present staff. Received instructions for my participation in the procedure from the performing physician.  

## 2021-12-06 NOTE — Telephone Encounter (Signed)
Returned pt's call. Prep went well overnight but she remains anxious QX:[id number] half.  Reassured. She will use Desitin cream as needed for skin irritation

## 2021-12-06 NOTE — Op Note (Signed)
[facility] Patient Name: Anna Price Procedure Date: 12/06/2021 2:09 PM MRN: [id number] Endoscopist: Gatha Mayer , MD Age: 76 Referring MD:  Date of Birth: January 08, 1946 Gender: Female Account #: [id number] Procedure:                Upper GI endoscopy Indications:              Suspected esophageal reflux, Globus sensation Medicines:                Monitored Anesthesia Care Procedure:                Pre-Anesthesia Assessment:                           - Prior to the procedure, a History and Physical                            was performed, and patient medications and                            allergies were reviewed. The patient's tolerance of                            previous anesthesia was also reviewed. The risks                            and benefits of the procedure and the sedation                            options and risks were discussed with the patient.                            All questions were answered, and informed consent                            was obtained. Prior Anticoagulants: The patient has                            taken no previous anticoagulant or antiplatelet                            agents. ASA Grade Assessment: II - A patient with                            mild systemic disease. After reviewing the risks                            and benefits, the patient was deemed in                            satisfactory condition to undergo the procedure.                           After obtaining informed consent, the endoscope was  passed under direct vision. Throughout the                            procedure, the patient's blood pressure, pulse, and                            oxygen saturations were monitored continuously. The                            GIF [id number] #[id number] was introduced through the                            mouth, and advanced to the second part of duodenum.                            The upper GI  endoscopy was accomplished without                            difficulty. The patient tolerated the procedure                            well. Scope In: Scope Out: Findings:                 The examined esophagus was normal.                           Multiple diminutive sessile polyps were found in                            the gastric fundus and in the gastric body.                           Patchy moderately erythematous mucosa was found in                            the gastric antrum.                           A small hiatal hernia was present.                           The examined duodenum was normal.                           The cardia and gastric fundus were normal on                            retroflexion. Complications:            No immediate complications. Estimated Blood Loss:     Estimated blood loss: none. Impression:               - Normal esophagus. Normal Z-line and no signs of                            Barrett's esophagus.                           -  Multiple gastric polyps. These are the same as                            seen in 2016 and are known benign fundic gland                            polyps.                           - Erythematous mucosa in the antrum. Patchy.                           - Small hiatal hernia.                           - Normal examined duodenum.                           - No specimens collected. Recommendation:           - Patient has a contact number available for                            emergencies. The signs and symptoms of potential                            delayed complications were discussed with the                            patient. Return to normal activities tomorrow.                            Written discharge instructions were provided to the                            patient.                           - See the other procedure note for documentation of                            additional recommendations.  Colonoscopy next Gatha Mayer, MD 12/06/2021 2:54:29 PM This report has been signed electronically.

## 2021-12-07 ENCOUNTER — Telehealth: Payer: Self-pay

## 2021-12-07 NOTE — Telephone Encounter (Signed)
  Follow up Call-     12/06/2021    1:11 PM  Call back number  Post procedure Call Back phone  # [phone]  Permission to leave phone message Yes     Patient questions:  Do you have a fever, pain , or abdominal swelling? No. Pain Score  0 *  Have you tolerated food without any problems? Yes.    Have you been able to return to your normal activities? Yes.    Do you have any questions about your discharge instructions: Diet   No. Medications  No. Follow up visit  No.  Do you have questions or concerns about your Care? No.  Actions: * If pain score is 4 or above: No action needed, pain <4.  Pt states she forgot to tell Dr Carlean Purl that the  feeling of needles/pins and pain to her RLQ she had had since her teens/twenties went away after she had felt a "pop" in the RLQ about a year ago, states she had been told it was gas between her appendix and her bowel and taking anti gas medication did help this pain over the years but it has gone away now.

## 2021-12-13 ENCOUNTER — Encounter: Payer: Self-pay | Admitting: Internal Medicine

## 2022-09-06 ENCOUNTER — Other Ambulatory Visit: Payer: Self-pay | Admitting: Obstetrics & Gynecology

## 2022-09-06 DIAGNOSIS — Z1231 Encounter for screening mammogram for malignant neoplasm of breast: Secondary | ICD-10-CM

## 2022-11-12 ENCOUNTER — Ambulatory Visit
Admission: RE | Admit: 2022-11-12 | Discharge: 2022-11-12 | Disposition: A | Discharge status: Home / Self Care | Payer: Medicare Other | Source: Ambulatory Visit | Attending: Obstetrics & Gynecology | Admitting: Obstetrics & Gynecology

## 2022-11-12 ENCOUNTER — Ambulatory Visit: Payer: Medicare Other

## 2022-11-12 DIAGNOSIS — Z1231 Encounter for screening mammogram for malignant neoplasm of breast: Secondary | ICD-10-CM

## 2022-12-21 IMAGING — MG MM DIGITAL SCREENING BILAT W/ TOMO AND CAD
8 series · 9 of 24 positions shown · non-contrast
Comparison: Previous exam(s).

CLINICAL DATA: Screening.

EXAM:
DIGITAL SCREENING BILATERAL MAMMOGRAM WITH TOMOSYNTHESIS AND CAD
TECHNIQUE: Bilateral screening digital craniocaudal and mediolateral oblique
mammograms were obtained. Bilateral screening digital breast
tomosynthesis was performed. The images were evaluated with
computer-aided detection.

[L MLO synth-2D]
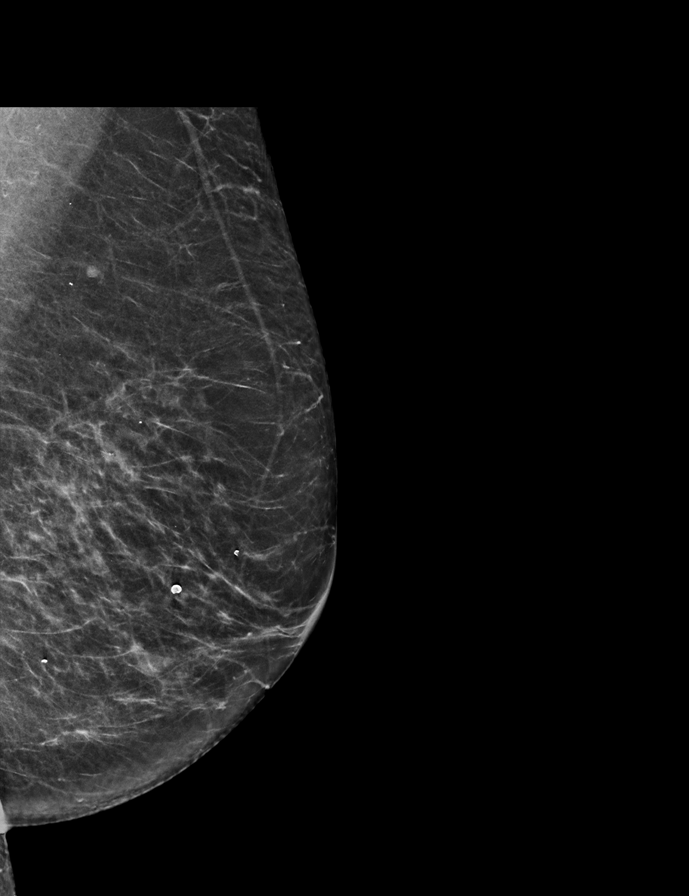

[R MLO synth-2D]
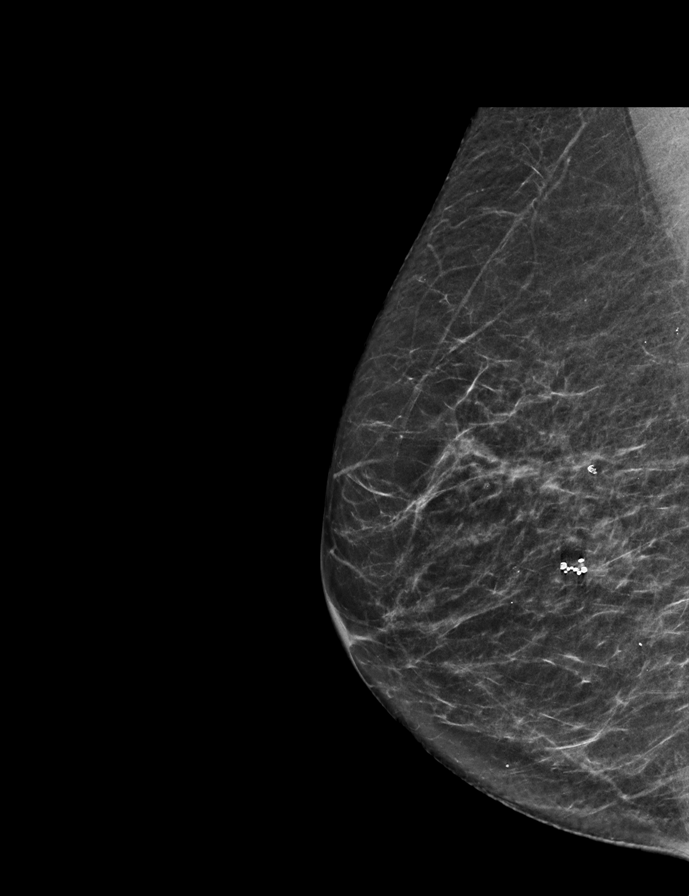

[R CC synth-2D]
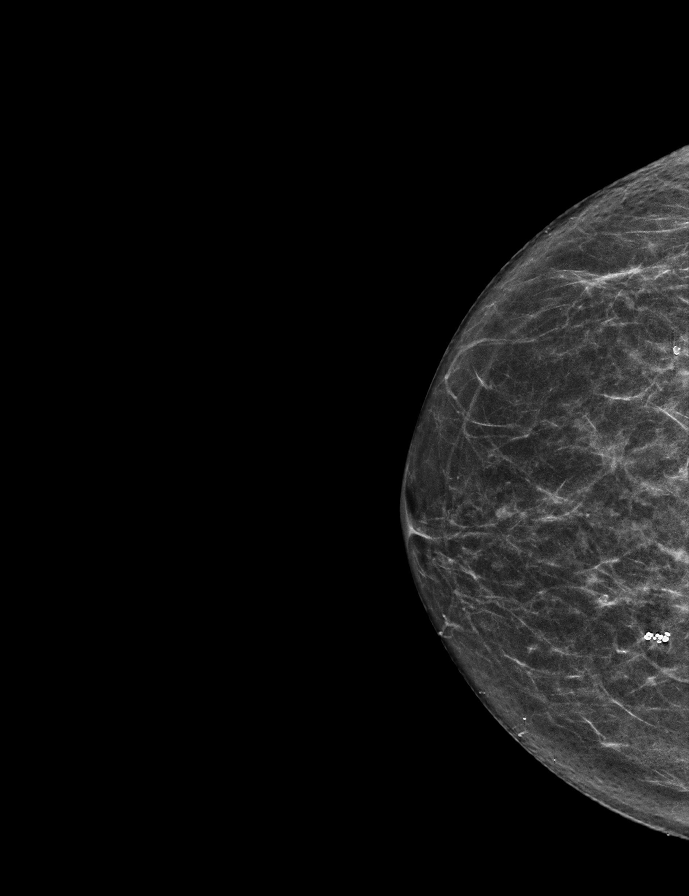

[L CC synth-2D]
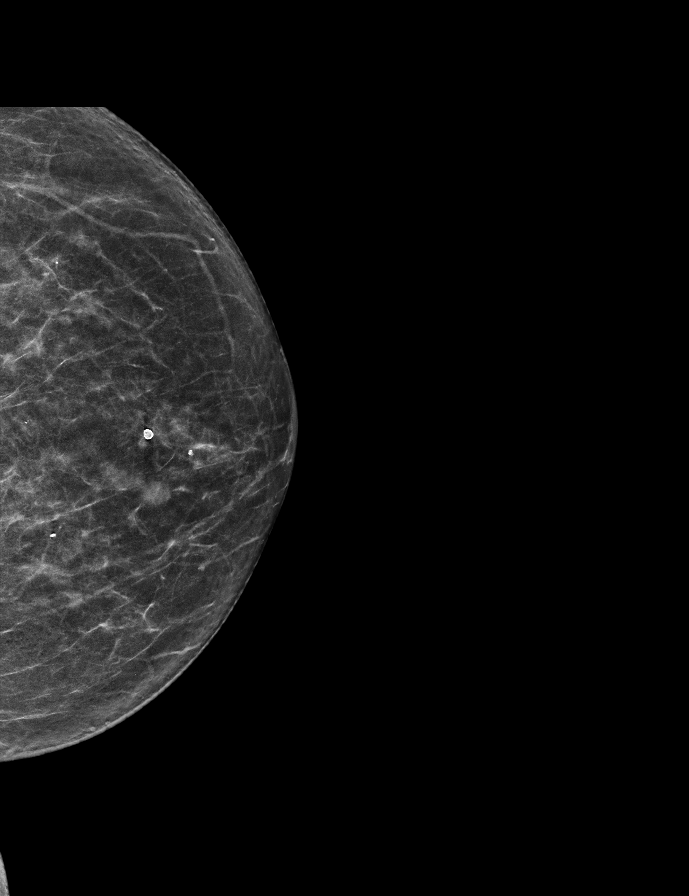

[R CC tomo · 2 of 55 frames shown]
[frame 18/55]
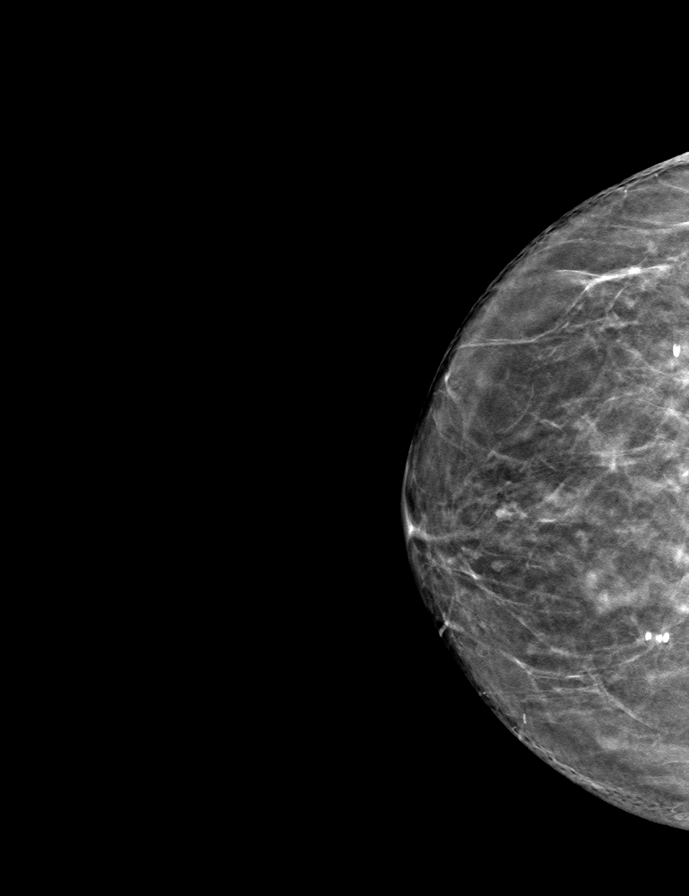
[frame 28/55]
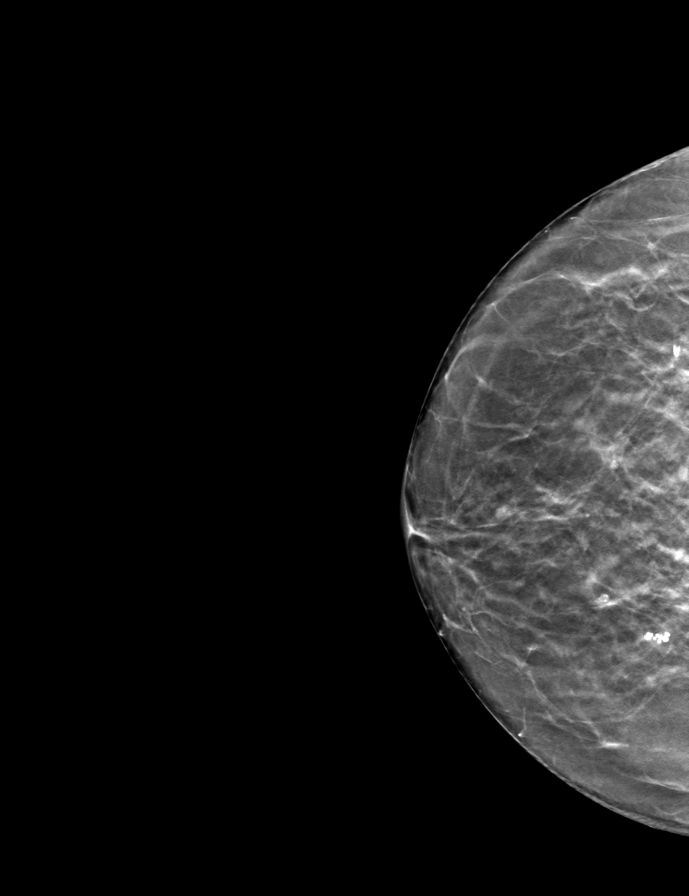

[L MLO tomo · tomo slice 30/59.0]
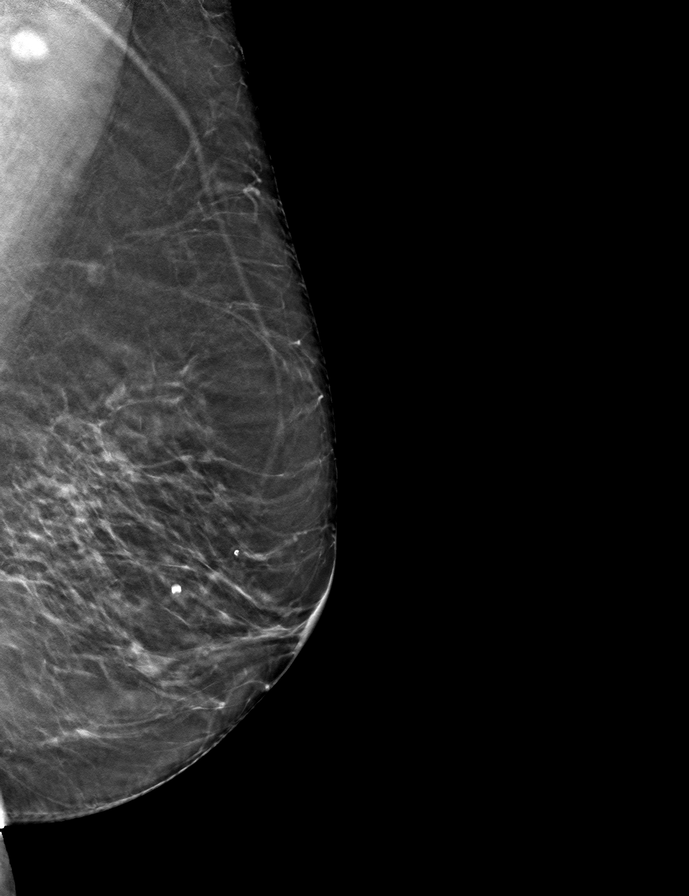

[L CC tomo · tomo slice 29/57.0]
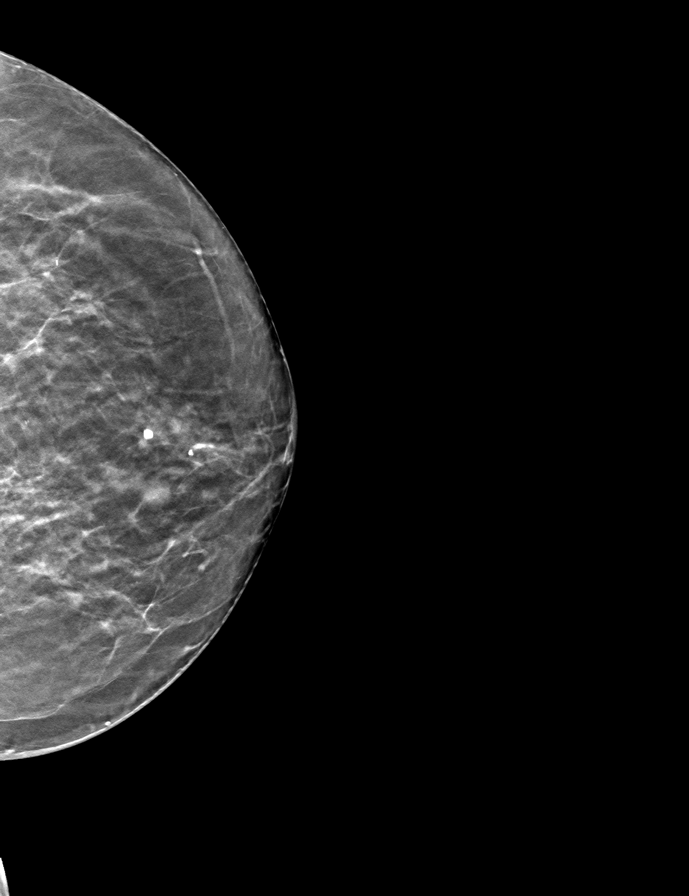

[R MLO tomo · tomo slice 30/59.0]
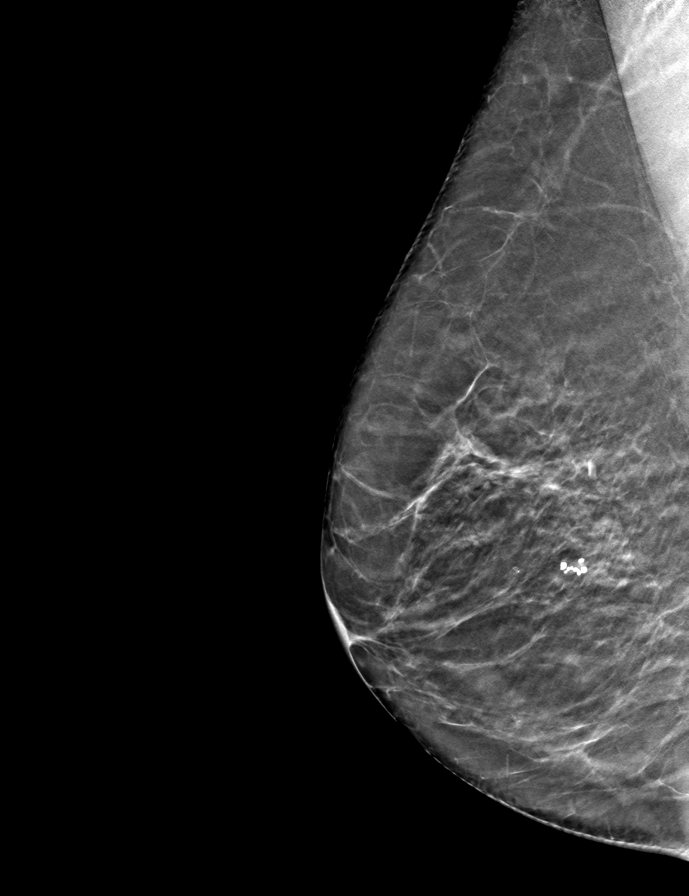

[9 of 24 positions shown; findings below may reference images not displayed]

ACR Breast Density Category b: There are scattered areas of
fibroglandular density.
FINDINGS: There are no findings suspicious for malignancy.
IMPRESSION: No mammographic evidence of malignancy. A result letter of this
screening mammogram will be mailed directly to the patient.

RECOMMENDATION:
Screening mammogram in one year. (Code:51-O-LD2)

BI-RADS CATEGORY  1: Negative.

## 2023-03-08 ENCOUNTER — Other Ambulatory Visit (HOSPITAL_BASED_OUTPATIENT_CLINIC_OR_DEPARTMENT_OTHER): Payer: Self-pay

## 2023-03-08 MED ORDER — COVID-19 MRNA VAC-TRIS(PFIZER) 30 MCG/0.3ML IM SUSY
0.3000 mL | PREFILLED_SYRINGE | Freq: Once | INTRAMUSCULAR | 0 refills | Status: AC
  Filled 2023-03-08: qty 0.3, 1d supply, fill #0

## 2023-03-11 ENCOUNTER — Other Ambulatory Visit (HOSPITAL_BASED_OUTPATIENT_CLINIC_OR_DEPARTMENT_OTHER): Payer: Self-pay

## 2023-05-20 ENCOUNTER — Institutional Professional Consult (permissible substitution): Payer: Medicare Other | Admitting: Pulmonary Disease

## 2023-09-17 ENCOUNTER — Other Ambulatory Visit: Payer: Self-pay | Admitting: Obstetrics & Gynecology

## 2023-09-17 DIAGNOSIS — Z1231 Encounter for screening mammogram for malignant neoplasm of breast: Secondary | ICD-10-CM

## 2023-11-13 ENCOUNTER — Ambulatory Visit
Admission: RE | Admit: 2023-11-13 | Discharge: 2023-11-13 | Disposition: A | Discharge status: Home / Self Care | Source: Ambulatory Visit | Attending: Obstetrics & Gynecology | Admitting: Obstetrics & Gynecology

## 2023-11-13 DIAGNOSIS — Z1231 Encounter for screening mammogram for malignant neoplasm of breast: Secondary | ICD-10-CM
